# Patient Record
Sex: Male | Born: 1950 | Race: Black or African American | Hispanic: No | Marital: Single | Smoking: Never smoker
Health system: Southern US, Community
[De-identification: ages and names within clinical notes are randomized; demographics above are authoritative.]

## PROBLEM LIST (undated history)

## (undated) DIAGNOSIS — E119 Type 2 diabetes mellitus without complications: Secondary | ICD-10-CM

## (undated) DIAGNOSIS — I10 Essential (primary) hypertension: Secondary | ICD-10-CM

## (undated) HISTORY — PX: NO PAST SURGERIES: SHX2092

---

## 2020-08-27 ENCOUNTER — Emergency Department: Payer: BC Managed Care – PPO

## 2020-08-27 ENCOUNTER — Ambulatory Visit: Payer: BC Managed Care – PPO

## 2020-08-27 ENCOUNTER — Ambulatory Visit (INDEPENDENT_AMBULATORY_CARE_PROVIDER_SITE_OTHER)
Admission: EM | Admit: 2020-08-27 | Discharge: 2020-08-27 | Disposition: A | Discharge status: Other Acute Inpatient Hospital | Payer: BC Managed Care – PPO | Source: Home / Self Care

## 2020-08-27 ENCOUNTER — Inpatient Hospital Stay
Admission: EM | Admit: 2020-08-27 | Discharge: 2020-08-29 | DRG: 872 | Disposition: A | Discharge status: Home / Self Care | Payer: BC Managed Care – PPO | Attending: Internal Medicine | Admitting: Internal Medicine

## 2020-08-27 ENCOUNTER — Other Ambulatory Visit: Payer: Self-pay

## 2020-08-27 DIAGNOSIS — Z6841 Body Mass Index (BMI) 40.0 and over, adult: Secondary | ICD-10-CM

## 2020-08-27 DIAGNOSIS — Z20822 Contact with and (suspected) exposure to covid-19: Secondary | ICD-10-CM | POA: Insufficient documentation

## 2020-08-27 DIAGNOSIS — W19XXXA Unspecified fall, initial encounter: Secondary | ICD-10-CM

## 2020-08-27 DIAGNOSIS — I10 Essential (primary) hypertension: Secondary | ICD-10-CM

## 2020-08-27 DIAGNOSIS — D72829 Elevated white blood cell count, unspecified: Secondary | ICD-10-CM | POA: Diagnosis not present

## 2020-08-27 DIAGNOSIS — N390 Urinary tract infection, site not specified: Secondary | ICD-10-CM | POA: Diagnosis present

## 2020-08-27 DIAGNOSIS — R1084 Generalized abdominal pain: Secondary | ICD-10-CM | POA: Insufficient documentation

## 2020-08-27 DIAGNOSIS — R309 Painful micturition, unspecified: Secondary | ICD-10-CM | POA: Insufficient documentation

## 2020-08-27 DIAGNOSIS — A419 Sepsis, unspecified organism: Secondary | ICD-10-CM | POA: Diagnosis present

## 2020-08-27 DIAGNOSIS — Z9114 Patient's other noncompliance with medication regimen: Secondary | ICD-10-CM

## 2020-08-27 DIAGNOSIS — Z7982 Long term (current) use of aspirin: Secondary | ICD-10-CM

## 2020-08-27 DIAGNOSIS — Z7984 Long term (current) use of oral hypoglycemic drugs: Secondary | ICD-10-CM | POA: Insufficient documentation

## 2020-08-27 DIAGNOSIS — R3915 Urgency of urination: Secondary | ICD-10-CM | POA: Insufficient documentation

## 2020-08-27 DIAGNOSIS — R509 Fever, unspecified: Secondary | ICD-10-CM | POA: Insufficient documentation

## 2020-08-27 DIAGNOSIS — R1010 Upper abdominal pain, unspecified: Secondary | ICD-10-CM | POA: Diagnosis not present

## 2020-08-27 DIAGNOSIS — R0602 Shortness of breath: Secondary | ICD-10-CM | POA: Insufficient documentation

## 2020-08-27 DIAGNOSIS — E119 Type 2 diabetes mellitus without complications: Secondary | ICD-10-CM | POA: Insufficient documentation

## 2020-08-27 DIAGNOSIS — N3000 Acute cystitis without hematuria: Secondary | ICD-10-CM

## 2020-08-27 DIAGNOSIS — E669 Obesity, unspecified: Secondary | ICD-10-CM | POA: Insufficient documentation

## 2020-08-27 HISTORY — DX: Essential (primary) hypertension: I10

## 2020-08-27 HISTORY — DX: Type 2 diabetes mellitus without complications: E11.9

## 2020-08-27 LAB — COMPREHENSIVE METABOLIC PANEL
ALT: 20 U/L (ref 0–44)
ALT: 18 U/L (ref 0–44)
AST: 26 U/L (ref 15–41)
AST: 26 U/L (ref 15–41)
Albumin: 3.9 g/dL (ref 3.5–5.0)
Albumin: 3.9 g/dL (ref 3.5–5.0)
Alkaline Phosphatase: 55 U/L (ref 38–126)
Alkaline Phosphatase: 52 U/L (ref 38–126)
Anion gap: 13 (ref 5–15)
Anion gap: 11 (ref 5–15)
BUN: 14 mg/dL (ref 8–23)
BUN: 14 mg/dL (ref 8–23)
CO2: 26 mmol/L (ref 22–32)
CO2: 24 mmol/L (ref 22–32)
Calcium: 9.6 mg/dL (ref 8.9–10.3)
Calcium: 9.5 mg/dL (ref 8.9–10.3)
Chloride: 98 mmol/L (ref 98–111)
Chloride: 100 mmol/L (ref 98–111)
Creatinine, Ser: 1.08 mg/dL (ref 0.61–1.24)
Creatinine, Ser: 1.04 mg/dL (ref 0.61–1.24)
GFR, Estimated: >60 mL/min (ref >60)
GFR, Estimated: >60 mL/min (ref >60)
Glucose, Bld: 193 mg/dL — ABNORMAL HIGH (ref 70–99)
Glucose, Bld: 176 mg/dL — ABNORMAL HIGH (ref 70–99)
Potassium: 3.9 mmol/L (ref 3.5–5.1)
Potassium: 3.6 mmol/L (ref 3.5–5.1)
Sodium: 137 mmol/L (ref 135–145)
Sodium: 135 mmol/L (ref 135–145)
Total Bilirubin: 2.2 mg/dL — ABNORMAL HIGH (ref 0.3–1.2)
Total Bilirubin: 1.8 mg/dL — ABNORMAL HIGH (ref 0.3–1.2)
Total Protein: 8.8 g/dL — ABNORMAL HIGH (ref 6.5–8.1)
Total Protein: 8.2 g/dL — ABNORMAL HIGH (ref 6.5–8.1)

## 2020-08-27 LAB — RESP PANEL BY RT-PCR (FLU A&B, COVID) ARPGX2
Influenza A by PCR: NEGATIVE
Influenza A by PCR: NEGATIVE
Influenza B by PCR: NEGATIVE
Influenza B by PCR: NEGATIVE
SARS Coronavirus 2 by RT PCR: NEGATIVE
SARS Coronavirus 2 by RT PCR: NEGATIVE

## 2020-08-27 LAB — CBC WITH DIFFERENTIAL/PLATELET
Abs Immature Granulocytes: 1.58 10*3/uL — ABNORMAL HIGH (ref 0.00–0.07)
Abs Immature Granulocytes: 1.08 10*3/uL — ABNORMAL HIGH (ref 0.00–0.07)
Basophils Absolute: 0.1 10*3/uL (ref 0.0–0.1)
Basophils Absolute: 0.1 10*3/uL (ref 0.0–0.1)
Basophils Relative: 0 %
Basophils Relative: 0 %
Eosinophils Absolute: 0 10*3/uL (ref 0.0–0.5)
Eosinophils Absolute: 0 10*3/uL (ref 0.0–0.5)
Eosinophils Relative: 0 %
Eosinophils Relative: 0 %
HCT: 35.5 % — ABNORMAL LOW (ref 39.0–52.0)
HCT: 34.9 % — ABNORMAL LOW (ref 39.0–52.0)
Hemoglobin: 11.8 g/dL — ABNORMAL LOW (ref 13.0–17.0)
Hemoglobin: 11.6 g/dL — ABNORMAL LOW (ref 13.0–17.0)
Immature Granulocytes: 5 %
Immature Granulocytes: 3 %
Lymphocytes Relative: 3 %
Lymphocytes Relative: 3 %
Lymphs Abs: 0.9 10*3/uL (ref 0.7–4.0)
Lymphs Abs: 1 10*3/uL (ref 0.7–4.0)
MCH: 31.6 pg (ref 26.0–34.0)
MCH: 32 pg (ref 26.0–34.0)
MCHC: 33.2 g/dL (ref 30.0–36.0)
MCHC: 33.2 g/dL (ref 30.0–36.0)
MCV: 95.2 fL (ref 80.0–100.0)
MCV: 96.1 fL (ref 80.0–100.0)
Monocytes Absolute: 1.6 10*3/uL — ABNORMAL HIGH (ref 0.1–1.0)
Monocytes Absolute: 1.9 10*3/uL — ABNORMAL HIGH (ref 0.1–1.0)
Monocytes Relative: 5 %
Monocytes Relative: 5 %
Neutro Abs: 29.6 10*3/uL — ABNORMAL HIGH (ref 1.7–7.7)
Neutro Abs: 30.3 10*3/uL — ABNORMAL HIGH (ref 1.7–7.7)
Neutrophils Relative %: 87 %
Neutrophils Relative %: 89 %
Platelets: 286 10*3/uL (ref 150–400)
Platelets: 291 10*3/uL (ref 150–400)
RBC: 3.73 MIL/uL — ABNORMAL LOW (ref 4.22–5.81)
RBC: 3.63 MIL/uL — ABNORMAL LOW (ref 4.22–5.81)
RDW: 12.8 % (ref 11.5–15.5)
RDW: 12.6 % (ref 11.5–15.5)
Smear Review: NORMAL
WBC: 33.8 10*3/uL — ABNORMAL HIGH (ref 4.0–10.5)
WBC: 34.3 10*3/uL — ABNORMAL HIGH (ref 4.0–10.5)
nRBC: 0 % (ref 0.0–0.2)
nRBC: 0 % (ref 0.0–0.2)

## 2020-08-27 LAB — URINALYSIS, COMPLETE (UACMP) WITH MICROSCOPIC
Bilirubin Urine: NEGATIVE
Glucose, UA: NEGATIVE mg/dL
Ketones, ur: NEGATIVE mg/dL
Nitrite: NEGATIVE
Protein, ur: 30 mg/dL — AB
Specific Gravity, Urine: >1.046 — ABNORMAL HIGH (ref 1.005–1.030)
pH: 7 (ref 5.0–8.0)

## 2020-08-27 LAB — PROTIME-INR
INR: 1.2 (ref 0.8–1.2)
Prothrombin Time: 14.9 seconds (ref 11.4–15.2)

## 2020-08-27 LAB — PROCALCITONIN: Procalcitonin: 2.63 ng/mL

## 2020-08-27 LAB — HIV ANTIBODY (ROUTINE TESTING W REFLEX): HIV Screen 4th Generation wRfx: NONREACTIVE

## 2020-08-27 LAB — APTT: aPTT: 34 seconds (ref 24–36)

## 2020-08-27 LAB — GLUCOSE, CAPILLARY
Glucose-Capillary: 136 mg/dL — ABNORMAL HIGH (ref 70–99)
Glucose-Capillary: 144 mg/dL — ABNORMAL HIGH (ref 70–99)

## 2020-08-27 LAB — LACTIC ACID, PLASMA: Lactic Acid, Venous: 1.9 mmol/L (ref 0.5–1.9)

## 2020-08-27 MED ORDER — INSULIN ASPART 100 UNIT/ML ~~LOC~~ SOLN: 0.0000 [IU] | Freq: Every day | SUBCUTANEOUS | Status: DC

## 2020-08-27 MED ORDER — IOHEXOL 300 MG/ML  SOLN
125.0000 mL | Freq: Once | INTRAMUSCULAR | Status: AC | PRN
  Administered 2020-08-27: 125 mL via INTRAVENOUS

## 2020-08-27 MED ORDER — SODIUM CHLORIDE 0.9 % IV BOLUS (SEPSIS)
1000.0000 mL | Freq: Once | INTRAVENOUS | Status: AC
  Administered 2020-08-27: 1000 mL via INTRAVENOUS

## 2020-08-27 MED ORDER — ACETAMINOPHEN 325 MG PO TABS
650.0000 mg | ORAL_TABLET | Freq: Four times a day (QID) | ORAL | Status: DC | PRN
  Administered 2020-08-28: 650 mg via ORAL
  Filled 2020-08-27: qty 2

## 2020-08-27 MED ORDER — ADULT MULTIVITAMIN W/MINERALS CH
1.0000 | ORAL_TABLET | Freq: Every day | ORAL | Status: DC
  Administered 2020-08-27 – 2020-08-29 (×3): 1 via ORAL
  Filled 2020-08-27 (×3): qty 1

## 2020-08-27 MED ORDER — ONDANSETRON HCL 4 MG/2ML IJ SOLN: 4.0000 mg | Freq: Three times a day (TID) | INTRAMUSCULAR | Status: DC | PRN

## 2020-08-27 MED ORDER — INSULIN ASPART 100 UNIT/ML ~~LOC~~ SOLN
0.0000 [IU] | Freq: Three times a day (TID) | SUBCUTANEOUS | Status: DC
  Administered 2020-08-27 – 2020-08-28 (×2): 1 [IU] via SUBCUTANEOUS
  Filled 2020-08-27 (×2): qty 1

## 2020-08-27 MED ORDER — ENOXAPARIN SODIUM 40 MG/0.4ML ~~LOC~~ SOLN: 40.0000 mg | SUBCUTANEOUS | Status: DC

## 2020-08-27 MED ORDER — LACTATED RINGERS IV SOLN: INTRAVENOUS | Status: AC

## 2020-08-27 MED ORDER — SODIUM CHLORIDE 0.9 % IV SOLN
2.0000 g | INTRAVENOUS | Status: DC
  Administered 2020-08-27 – 2020-08-29 (×3): 2 g via INTRAVENOUS
  Filled 2020-08-27: qty 2
  Filled 2020-08-27 (×4): qty 20

## 2020-08-27 MED ORDER — SODIUM CHLORIDE 0.9 % IV BOLUS (SEPSIS)
800.0000 mL | Freq: Once | INTRAVENOUS | Status: AC
  Administered 2020-08-27: 800 mL via INTRAVENOUS

## 2020-08-27 MED ORDER — ENOXAPARIN SODIUM 80 MG/0.8ML ~~LOC~~ SOLN
0.5000 mg/kg | SUBCUTANEOUS | Status: DC
  Administered 2020-08-27 – 2020-08-28 (×2): 62.5 mg via SUBCUTANEOUS
  Filled 2020-08-27 (×3): qty 0.8

## 2020-08-27 MED ORDER — HYDRALAZINE HCL 20 MG/ML IJ SOLN: 5.0000 mg | INTRAMUSCULAR | Status: DC | PRN

## 2020-08-27 MED ORDER — SODIUM CHLORIDE 0.9 % IV SOLN
2.0000 g | Freq: Once | INTRAVENOUS | Status: AC
  Administered 2020-08-27: 2 g via INTRAVENOUS
  Filled 2020-08-27: qty 2

## 2020-08-27 MED ORDER — ASPIRIN EC 81 MG PO TBEC
81.0000 mg | DELAYED_RELEASE_TABLET | Freq: Every day | ORAL | Status: DC
  Administered 2020-08-27 – 2020-08-29 (×3): 81 mg via ORAL
  Filled 2020-08-27 (×3): qty 1

## 2020-08-27 MED ORDER — METRONIDAZOLE IN NACL 5-0.79 MG/ML-% IV SOLN
500.0000 mg | Freq: Once | INTRAVENOUS | Status: AC
  Administered 2020-08-27: 500 mg via INTRAVENOUS
  Filled 2020-08-27: qty 100

## 2020-08-27 MED ORDER — VANCOMYCIN HCL IN DEXTROSE 1-5 GM/200ML-% IV SOLN
1000.0000 mg | Freq: Once | INTRAVENOUS | Status: AC
  Administered 2020-08-27: 1000 mg via INTRAVENOUS
  Filled 2020-08-27: qty 200

## 2020-08-27 NOTE — Progress Notes (Signed)
PHARMACY -  BRIEF ANTIBIOTIC NOTE   Pharmacy has received consult(s) for Cefepime and Vancomycin from an ED provider.  The patient's profile has been reviewed for ht/wt/allergies/indication/available labs.    One time order(s) placed by MD for Cefepime 2gm and Vancomycin 1 gram  Further antibiotics/pharmacy consults should be ordered by admitting physician if indicated.                       Thank you, Binh Doten A 08/27/2020  11:08 AM

## 2020-08-27 NOTE — ED Notes (Signed)
Patient is being discharged from the Urgent Care and sent to the [facility] Emergency Department via EMS. Per Becky Augusta, NP, patient is in need of higher level of care due to abnormal lab and vital signs. Patient is aware and verbalizes understanding of plan of care.  Vitals:   08/27/20 0922  BP: (!) 168/81  Pulse: (!) 116  Resp: (!) 40  Temp: (!) 101.8 F (38.8 C)  SpO2: 95%

## 2020-08-27 NOTE — Progress Notes (Signed)
CODE SEPSIS - PHARMACY COMMUNICATION  **Broad Spectrum Antibiotics should be administered within 1 hour of Sepsis diagnosis**  Time Code Sepsis Called/Page Received: 08/27/20 1105  Antibiotics Ordered: Cefepime and Vanc  Time of 1st antibiotic administration: 1116  Additional action taken by pharmacy:    If necessary, Name of Provider/Nurse Contacted:      Angelique Blonder ,PharmD Clinical Pharmacist  08/27/2020  11:32 AM

## 2020-08-27 NOTE — Discharge Instructions (Addendum)
Patient transported to [facility] via EMS for further evaluation based upon his tachypnea, febrile state, and leukocytosis.

## 2020-08-27 NOTE — Progress Notes (Signed)
PHARMACIST - PHYSICIAN COMMUNICATION  CONCERNING:  Enoxaparin (Lovenox) for DVT Prophylaxis    RECOMMENDATION: Patient was prescribed enoxaprin 40mg  q24 hours for VTE prophylaxis.   Filed Weights   08/27/20 1054  Weight: 127 kg (279 lb 15.8 oz)    Body mass index is 51.21 kg/m.  Estimated Creatinine Clearance: 79.3 mL/min (by C-G formula based on SCr of 1.04 mg/dL).   Based on [facility] policy patient is candidate for enoxaparin 0.5mg /kg TBW SQ every 24 hours based on BMI being >30.  DESCRIPTION: Pharmacy has adjusted enoxaparin dose per [facility] policy.  Patient is now receiving enoxaparin 62.5 mg every 24 hours    [facility], PharmD Clinical Pharmacist  08/27/2020 2:09 PM

## 2020-08-27 NOTE — ED Triage Notes (Signed)
Patient complains of upper abdominal pain that started yesterday. States that he has been unable to eat since then. Patient states that he has also been short of breath. Patient states that he fell coming in to the building today and thinks he got "turned around". Another patient saw this happened and was concerned that he fainted.

## 2020-08-27 NOTE — ED Triage Notes (Signed)
Pt to ED from UC via [facility] for chief c/o abdominal pain since yesterday. Ems reports wbc 33. Temp 99.8. ST with PVCs

## 2020-08-27 NOTE — ED Provider Notes (Signed)
[facility] Emergency Department Provider Note  ____________________________________________   Event Date/Time   First MD Initiated Contact with Patient 08/27/20 1049     (approximate)  I have reviewed the triage vital signs and the nursing notes.   HISTORY  Chief Complaint Abdominal Pain    HPI Daniel Trevino is a 70 y.o. male with diabetes hypertension who comes in with upper abdominal pain.  Patient was seen at urgent care.  On review of records patient was noted to be tachycardic and febrile to 101.8 with generalized abdominal pain.  Patient states he has had abdominal pain for the past few days, constant, lower abdomen, nothing makes better, nothing makes it worse.  He denies any chest pain, shortness of breath.  Denies any dysuria.  Patient had a white count of 33.  LFTs were negative.  COVID and flu were negative.  However given patient meets sepsis criteria he was sent for the emergency department for further work-up.          Past Medical History:  Diagnosis Date  . Diabetes mellitus without complication (HCC)   . Hypertension     There are no problems to display for this patient.   Past Surgical History:  Procedure Laterality Date  . NO PAST SURGERIES      Prior to Admission medications   Medication Sig Start Date End Date Taking? Authorizing Provider  aspirin 81 MG EC tablet Take by mouth.    [provider]  metFORMIN (GLUCOPHAGE) 500 MG tablet Take 500 mg by mouth 2 (two) times daily. 07/21/20   [provider]    Allergies Patient has no known allergies.  No family history on file.  Social History Social History   Tobacco Use  . Smoking status: Never Smoker  . Smokeless tobacco: Never Used  Vaping Use  . Vaping Use: Never used  Substance Use Topics  . Alcohol use: Not Currently  . Drug use: Not Currently      Review of Systems Constitutional: Positive fevers Eyes: No visual changes. ENT: No  sore throat. Cardiovascular: Denies chest pain. Respiratory: Denies shortness of breath. Gastrointestinal: Abdominal pain no nausea, no vomiting.  No diarrhea.  No constipation. Genitourinary: Negative for dysuria. Musculoskeletal: Negative for back pain. Skin: Negative for rash. Neurological: Negative for headaches, focal weakness or numbness. All other ROS negative ____________________________________________   PHYSICAL EXAM:  VITAL SIGNS: Blood pressure 131/65, pulse 92, temperature 100 F (37.8 C), temperature source Oral, resp. rate (!) 35, height 5\' 2"  (1.575 m), weight 127 kg, SpO2 97 %.  Constitutional: Alert and oriented.  Mild distress Eyes: Conjunctivae are normal. EOMI. Head: Atraumatic. Nose: No congestion/rhinnorhea. Mouth/Throat: Mucous membranes are moist.   Neck: No stridor. Trachea Midline. FROM Cardiovascular: Tachycardic, regular rhythm. Grossly normal heart sounds.  Good peripheral circulation. Respiratory: Normal respiratory effort.  No retractions. Lungs CTAB. Gastrointestinal: Soft but tender in the lower abdomen no distention. No abdominal bruits.  Musculoskeletal: No lower extremity tenderness nor edema.  No joint effusions. Neurologic:  Normal speech and language. No gross focal neurologic deficits are appreciated.  Skin:  Skin is warm, dry and intact. No rash noted. Psychiatric: Mood and affect are normal. Speech and behavior are normal. GU: Deferred   ____________________________________________   LABS (all labs ordered are listed, but only abnormal results are displayed)  Labs Reviewed  COMPREHENSIVE METABOLIC PANEL - Abnormal; Notable for the following components:      Result Value   Glucose, Bld 176 (*)  Total Protein 8.2 (*)    Total Bilirubin 1.8 (*)    All other components within normal limits  CBC WITH DIFFERENTIAL/PLATELET - Abnormal; Notable for the following components:   WBC 34.3 (*)    RBC 3.63 (*)    Hemoglobin 11.6 (*)     HCT 34.9 (*)    Neutro Abs 30.3 (*)    Monocytes Absolute 1.9 (*)    Abs Immature Granulocytes 1.08 (*)    All other components within normal limits  URINALYSIS, COMPLETE (UACMP) WITH MICROSCOPIC - Abnormal; Notable for the following components:   Color, Urine YELLOW (*)    APPearance CLEAR (*)    Specific Gravity, Urine >1.046 (*)    Hgb urine dipstick MODERATE (*)    Protein, ur 30 (*)    Leukocytes,Ua MODERATE (*)    Bacteria, UA FEW (*)    All other components within normal limits  RESP PANEL BY RT-PCR (FLU A&B, COVID) ARPGX2  CULTURE, BLOOD (ROUTINE X 2)  CULTURE, BLOOD (ROUTINE X 2)  URINE CULTURE  LACTIC ACID, PLASMA  PROTIME-INR  APTT   ____________________________________________   ED ECG REPORT I, Concha Se, the attending physician, personally viewed and interpreted this ECG.  Sinus tachycardia rate of 115, no ST elevation, no T wave inversions, normal intervals ____________________________________________  RADIOLOGY Vela Prose, personally viewed and evaluated these images (plain radiographs) as part of my medical decision making, as well as reviewing the written report by the radiologist.  ED MD interpretation: No pneumonia noted  Official radiology report(s): DG Chest Port 1 View  Result Date: 08/27/2020 CLINICAL DATA:  Chest pain and questionable sepsis EXAM: PORTABLE CHEST 1 VIEW COMPARISON:  None. FINDINGS: Cardiac shadow is enlarged. Central vascular congestion is noted. No significant CAD is seen. No focal infiltrate is noted. IMPRESSION: Mild central vascular congestion without definitive edema. Electronically Signed   By: Alcide Clever M.D.   On: 08/27/2020 11:20    ____________________________________________   PROCEDURES  Procedure(s) performed (including Critical Care):  .Critical Care Performed by: Concha Se, MD Authorized by: Concha Se, MD   Critical care provider statement:    Critical care time (minutes):  45   Critical  care was necessary to treat or prevent imminent or life-threatening deterioration of the following conditions:  Sepsis   Critical care was time spent personally by me on the following activities:  Discussions with consultants, evaluation of patient's response to treatment, examination of patient, ordering and performing treatments and interventions, ordering and review of laboratory studies, ordering and review of radiographic studies, pulse oximetry, re-evaluation of patient's condition, obtaining history from patient or surrogate and review of old charts .1-3 Lead EKG Interpretation Performed by: Concha Se, MD Authorized by: Concha Se, MD     Interpretation: normal     ECG rate:  90s    ECG rate assessment: normal     Rhythm: sinus rhythm     Ectopy: none     Conduction: normal   Comments:     Initially tachycardic but improved with fluid     ____________________________________________   INITIAL IMPRESSION / ASSESSMENT AND PLAN / ED COURSE  Daniel Trevino was evaluated in Emergency Department on 08/27/2020 for the symptoms described in the history of present illness. He was evaluated in the context of the global COVID-19 pandemic, which necessitated consideration that the patient might be at risk for infection with the SARS-CoV-2 virus that causes COVID-19. Institutional protocols and algorithms that  pertain to the evaluation of patients at risk for COVID-19 are in a state of rapid change based on information released by regulatory bodies including the CDC and federal and state organizations. These policies and algorithms were followed during the patient's care in the ED.    Patient came in tachycardic with elevated white count and fever at outside urgent care.  Patient meet SIRS criteria and I am concerned about sepsis.  Sepsis alert was called.  Patient was started on broad-spectrum antibiotics given unclear source plan concerned mostly about abdominal source as well as fluid  resuscitation with ideal body weight at 30 cc/kg.  Chest x-ray ordered to evaluate for pneumonia, urine to evaluate for UTI.  CT scan to evaluate for appendicitis, diverticulitis or other acute abdominal infection.  Lactate was normal  White count significantly elevated at 34  Repeat post focused examination for sepsis protocol shows patient remains alert and oriented x3, cardiovascularly he feels warm and well-perfused and heart rate has now resolved, respiratory patient does not appear to be in any acute respiratory distress, no stridor, no edema in his legs.    CT scan concerning for discitis with decompressed urinary bladder.  His UA does have 21-50 WBCs.  Urine culture is pending.  Given patient's sepsis criteria I think would be best to admit him to the hospital for rule out of blood cultures.  Discussed with patient he feels comfortable with this plan       ____________________________________________   FINAL CLINICAL IMPRESSION(S) / ED DIAGNOSES   Final diagnoses:  Sepsis, due to unspecified organism, unspecified whether acute organ dysfunction present (HCC)  Acute cystitis without hematuria      MEDICATIONS GIVEN DURING THIS VISIT:  Medications  lactated ringers infusion (0 mLs Intravenous Hold 08/27/20 1110)  sodium chloride 0.9 % bolus 1,000 mL (1,000 mLs Intravenous New Bag/Given 08/27/20 1113)    And  sodium chloride 0.9 % bolus 800 mL (800 mLs Intravenous New Bag/Given 08/27/20 1149)  ceFEPIme (MAXIPIME) 2 g in sodium chloride 0.9 % 100 mL IVPB (0 g Intravenous Stopped 08/27/20 1148)  metroNIDAZOLE (FLAGYL) IVPB 500 mg (500 mg Intravenous New Bag/Given 08/27/20 1115)  vancomycin (VANCOCIN) IVPB 1000 mg/200 mL premix (1,000 mg Intravenous New Bag/Given 08/27/20 1149)  iohexol (OMNIPAQUE) 300 MG/ML solution 125 mL (125 mLs Intravenous Contrast Given 08/27/20 1212)     ED Discharge Orders    None       Note:  This document was prepared using Dragon voice  recognition software and may include unintentional dictation errors.   Concha Se, MD 08/27/20 1350

## 2020-08-27 NOTE — ED Provider Notes (Signed)
[facility]    CSN: [id number] Arrival date & time: 08/27/20  0907      History   Chief Complaint Chief Complaint  Patient presents with  . Abdominal Pain    HPI Daniel Trevino is a 70 y.o. male.   HPI   70 year old male here for evaluation of upper abdominal pain.  Patient reports that he developed upper abdominal pain, decreased appetite, and shortness of breath last night.  Patient suffered a fall coming into the building.  Patient states that he remembers the fall but the other patient's family member who helped him up said he seemed very disoriented.  Additionally, patient reports that he has been having urinary urgency, frequency, and pain with urination.  Patient states that he moves his bowels every time he urinates but not much is coming out.  Patient asked about diarrhea and he was noncommittal in his response.  Patient has a history of type 2 diabetes and high blood pressure.  Patient is currently taking Metformin but states that he is not taking his hydrochlorothiazide.  Patient denies seeing any blood in his stool.  Patient asked if he was urinating blood and he denied but when asked if his urine was cloudy he said that his urine was red.  Past Medical History:  Diagnosis Date  . Diabetes mellitus without complication (HCC)   . Hypertension     There are no problems to display for this patient.   Past Surgical History:  Procedure Laterality Date  . NO PAST SURGERIES         Home Medications    Prior to Admission medications   Medication Sig Start Date End Date Taking? Authorizing Provider  aspirin 81 MG EC tablet Take by mouth.   Yes [provider]  metFORMIN (GLUCOPHAGE) 500 MG tablet Take 500 mg by mouth 2 (two) times daily. 07/21/20  Yes [provider]    Family History History reviewed. No pertinent family history.  Social History Social History   Tobacco Use  . Smoking status: Never Smoker  . Smokeless tobacco:  Never Used  Vaping Use  . Vaping Use: Never used  Substance Use Topics  . Alcohol use: Not Currently  . Drug use: Not Currently     Allergies   Patient has no known allergies.   Review of Systems Review of Systems  Constitutional: Positive for appetite change and fatigue. Negative for activity change and fever.  Gastrointestinal: Positive for abdominal pain. Negative for blood in stool, nausea and vomiting.  Genitourinary: Positive for dysuria, frequency, hematuria and urgency.  Skin: Negative for rash.  Hematological: Negative.   Psychiatric/Behavioral: Negative.      Physical Exam Triage Vital Signs ED Triage Vitals [08/27/20 0922]  Enc Vitals Group     BP (!) 168/81     Pulse Rate (!) 116     Resp 19     Temp (!) 101.8 F (38.8 C)     Temp Source Oral     SpO2 95 %     Weight 280 lb (127 kg)     Height 5\' 2"  (1.575 m)     Head Circumference      Peak Flow      Pain Score 8     Pain Loc      Pain Edu?      Excl. in GC?    No data found.  Updated Vital Signs BP (!) 168/81 (BP Location: Left Arm)   Pulse (!) 116  Temp (!) 101.8 F (38.8 C) (Oral)   Resp (!) 40   Ht 5\' 2"  (1.575 m)   Wt 280 lb (127 kg)   SpO2 95%   BMI 51.21 kg/m   Visual Acuity Right Eye Distance:   Left Eye Distance:   Bilateral Distance:    Right Eye Near:   Left Eye Near:    Bilateral Near:     Physical Exam Vitals and nursing note reviewed.  Constitutional:      Appearance: He is obese. He is ill-appearing. He is not diaphoretic.  HENT:     Head: Normocephalic and atraumatic.  Cardiovascular:     Rate and Rhythm: Normal rate.     Heart sounds: Normal heart sounds. No murmur heard. No gallop.   Pulmonary:     Effort: Pulmonary effort is normal.     Breath sounds: Normal breath sounds. No wheezing, rhonchi or rales.  Abdominal:     General: Abdomen is protuberant. Bowel sounds are normal. There is distension.     Palpations: Abdomen is soft. There is no hepatomegaly  or splenomegaly.     Tenderness: There is generalized abdominal tenderness.     Hernia: No hernia is present.  Skin:    General: Skin is warm and dry.     Capillary Refill: Capillary refill takes less than 2 seconds.     Findings: No erythema or rash.  Neurological:     General: No focal deficit present.     Mental Status: He is alert and oriented to person, place, and time.  Psychiatric:        Mood and Affect: Mood normal.        Behavior: Behavior normal.      UC Treatments / Results  Labs (all labs ordered are listed, but only abnormal results are displayed) Labs Reviewed  CBC WITH DIFFERENTIAL/PLATELET - Abnormal; Notable for the following components:      Result Value   WBC 33.8 (*)    RBC 3.73 (*)    Hemoglobin 11.8 (*)    HCT 35.5 (*)    All other components within normal limits  COMPREHENSIVE METABOLIC PANEL - Abnormal; Notable for the following components:   Glucose, Bld 193 (*)    Total Protein 8.8 (*)    Total Bilirubin 2.2 (*)    All other components within normal limits  RESP PANEL BY RT-PCR (FLU A&B, COVID) ARPGX2    EKG   Radiology No results found.  Procedures Procedures (including critical care time)  Medications Ordered in UC Medications - No data to display  Initial Impression / Assessment and Plan / UC Course  I have reviewed the triage vital signs and the nursing notes.  Pertinent labs & imaging results that were available during my care of the patient were reviewed by me and considered in my medical decision making (see chart for details).   Patient is here for evaluation of abdominal pain and shortness of breath.  Patient is an obese, ill-appearing 70 year old male who may have had a syncopal episode on the way into the clinic but it is unclear.  Patient states that he remembers the fall and that he just got off balance.  Patient is complaining of fatigue and he does have demonstrable weakness.  Patient had difficulty transitioning from the  wheelchair to the stretcher.  Patient is tachypneic with a respiratory rate around 32 after transitioning to the stretcher.  Lung sounds are clear in all fields.  Patient's oral mucous  membranes are dry and sticky.  Patient has ashy skin and decreased skin turgor on exam.  Patient's abdomen is distended with positive bowel sounds in all quadrants.  Patient has diffuse tenderness to palpation with no focal tenderness identified.  Well check COVID swab, CBC, CMP, UA, and obtain chest x-ray.  CBC shows a WBC count of 33.8, H&H is 11.8 and 35.5.  CMP shows a glucose of 193 and an elevated total protein of 8.8.  Transaminases are normal, sodium is 137, potassium 3.9, chloride 98.  Patient's anion gap is 13.  [facility] panel is negative for Covid and influenza.  Based on white count, tachypnea, fever, and fatigue will send patient to the emergency department for evaluation.  Report called to [facility] the charge nurse in the ER at [facility].  Awaiting EMS for transport.  Discussed the need to have a hospital evaluation with patient and he is agreeable.  Attempted to collect a urine sample from the patient but he was unable to void.  Patient does have a saturated diaper in place with colored urine.    Patient transported to [facility] via EMS.  Final Clinical Impressions(s) / UC Diagnoses   Final diagnoses:  Generalized abdominal pain  Leukocytosis, unspecified type     Discharge Instructions     Patient transported to [facility] via EMS for further evaluation based upon his tachypnea, febrile state, and leukocytosis.    ED Prescriptions    None     PDMP not reviewed this encounter.   Becky Augusta, NP 08/27/20 1015

## 2020-08-27 NOTE — ED Notes (Signed)
This RN to bedside to answer call light. Pt found to have gotten out of stretcher with side rails up, pulled out IV, stool on bed. Pt stated he was trying to see what was on his lunch tray.  This RN and caitlyn NT changed pt, linens changed, positioned pt where able to reach lunch tray.  Admitting MD at bedside

## 2020-08-27 NOTE — Sepsis Progress Note (Signed)
eLink is following this Code Sepsis. °

## 2020-08-27 NOTE — H&P (Signed)
History and Physical    Mikelle Minear TLX:[id number] DOB: [birth year] DOA: 08/27/2020  Referring MD/NP/PA:   PCP: Patient, No Pcp Per   Patient coming from:  The patient is coming from home.  At baseline, pt is independent for most of ADL.        Chief Complaint: Fever, dysuria, abdominal pain  HPI: Daniel Trevino is a 70 y.o. male with medical history significant of hypertension, diabetes mellitus, who presents with fever, dysuria and abdominal pain.  Patient states that he started having dysuria, increased urinary frequency, burning on urination since yesterday.  He also complains of suprapubic abdominal pain which is constant, mild to moderate, aching, nonradiating.  No hematuria. He also has fever and chills.  Patient denies chest pain, shortness breath, cough to me.  No nausea, vomiting, diarrhea.  Patient states that he has generalized weakness.  He was seen in UC and was sent to ED. He states that while he was waiting at the outside of clinic, he fell accidentally.  He strongly denies any head or neck injury.  Denies loss of consciousness or passing out.  He refused CT scan of head and neck.  ED Course: pt was found to have WBC 34.3, lactic acid 1.9, INR 1.2, PTT 34, negative Covid PCR, positive urinalysis (clear appearance, moderate amount of leukocyte, few bacteria, WBC 21-50), electrolytes renal function okay, temperature one 1.8, blood pressure 131/65, tachycardia with heart rate of 117, tachypnea with RR 40, 35, oxygen saturation 95% on room air.  Chest x-ray showed vascular congestion no obvious edema.  CT abdomen/pelvis showed acute cystitis without hydronephrosis or obstructive stone.  Patient is admitted to MedSurg bed as inpatient   Review of Systems:   General: has fevers, chills, no body weight gain, has fatigue HEENT: no blurry vision, hearing changes or sore throat Respiratory: no dyspnea, coughing, wheezing CV: no chest pain, no palpitations GI: no nausea, vomiting,  has lower abdominal pain, no diarrhea, constipation GU: has dysuria, burning on urination, has increased urinary frequency, no hematuria  Ext: no leg edema Neuro: no unilateral weakness, numbness, or tingling, no vision change or hearing loss. Has fall Skin: no rash, no skin tear. MSK: No muscle spasm, no deformity, no limitation of range of movement in spin Heme: No easy bruising.  Travel history: No recent long distant travel.  Allergy: No Known Allergies  Past Medical History:  Diagnosis Date  . Diabetes mellitus without complication (HCC)   . Hypertension     Past Surgical History:  Procedure Laterality Date  . NO PAST SURGERIES      Social History:  reports that he has never smoked. He has never used smokeless tobacco. He reports previous alcohol use. He reports previous drug use.  Family History: Reviewed with patient, patient states that all family members do not have significant medical issues.  Prior to Admission medications   Medication Sig Start Date End Date Taking? Authorizing Provider  aspirin 81 MG EC tablet Take by mouth.    [provider]  metFORMIN (GLUCOPHAGE) 500 MG tablet Take 500 mg by mouth 2 (two) times daily. 07/21/20   [provider]    Physical Exam: Vitals:   08/27/20 1053 08/27/20 1054 08/27/20 1100 08/27/20 1130  BP: (!) 166/78  (!) 164/69 131/65  Pulse: (!) 117  (!) 104 92  Resp: (!) 26  (!) 34 (!) 35  Temp: 100 F (37.8 C)     TempSrc: Oral     SpO2: 95%  95%  97%  Weight:  127 kg    Height:  5\' 2"  (1.575 m)     General: Not in acute distress HEENT:       Eyes: PERRL, EOMI, no scleral icterus.       ENT: No discharge from the ears and nose, no pharynx injection, no tonsillar enlargement.        Neck: No JVD, no bruit, no mass felt. Heme: No neck lymph node enlargement. Cardiac: S1/S2, RRR, No murmurs, No gallops or rubs. Respiratory: No rales, wheezing, rhonchi or rubs. GI: Soft, nondistended, has tenderness in  suprapubic abdomen, no rebound pain, no organomegaly, BS present. GU: No hematuria Ext: No pitting leg edema bilaterally. 1+DP/PT pulse bilaterally. Musculoskeletal: No joint deformities, No joint redness or warmth, no limitation of ROM in spin. Skin: No rashes.  Neuro: Alert, oriented X3, cranial nerves II-XII grossly intact, moves all extremities normally. Psych: Patient is not psychotic, no suicidal or hemocidal ideation.  Labs on Admission: I have personally reviewed following labs and imaging studies  CBC: Recent Labs  Lab 08/27/20 0930 08/27/20 1105  WBC 33.8* 34.3*  NEUTROABS 29.6* 30.3*  HGB 11.8* 11.6*  HCT 35.5* 34.9*  MCV 95.2 96.1  PLT 286 291   Basic Metabolic Panel: Recent Labs  Lab 08/27/20 0930 08/27/20 1105  NA 137 135  K 3.9 3.6  CL 98 100  CO2 26 24  GLUCOSE 193* 176*  BUN 14 14  CREATININE 1.08 1.04  CALCIUM 9.6 9.5   GFR: Estimated Creatinine Clearance: 79.3 mL/min (by C-G formula based on SCr of 1.04 mg/dL). Liver Function Tests: Recent Labs  Lab 08/27/20 0930 08/27/20 1105  AST 26 26  ALT 20 18  ALKPHOS 55 52  BILITOT 2.2* 1.8*  PROT 8.8* 8.2*  ALBUMIN 3.9 3.9   No results for input(s): LIPASE, AMYLASE in the last 168 hours. No results for input(s): AMMONIA in the last 168 hours. Coagulation Profile: Recent Labs  Lab 08/27/20 1105  INR 1.2   Cardiac Enzymes: No results for input(s): CKTOTAL, CKMB, CKMBINDEX, TROPONINI in the last 168 hours. BNP (last 3 results) No results for input(s): PROBNP in the last 8760 hours. HbA1C: No results for input(s): HGBA1C in the last 72 hours. CBG: No results for input(s): GLUCAP in the last 168 hours. Lipid Profile: No results for input(s): CHOL, HDL, LDLCALC, TRIG, CHOLHDL, LDLDIRECT in the last 72 hours. Thyroid Function Tests: No results for input(s): TSH, T4TOTAL, FREET4, T3FREE, THYROIDAB in the last 72 hours. Anemia Panel: No results for input(s): VITAMINB12, FOLATE, FERRITIN, TIBC,  IRON, RETICCTPCT in the last 72 hours. Urine analysis:    Component Value Date/Time   COLORURINE YELLOW (A) 08/27/2020 1105   APPEARANCEUR CLEAR (A) 08/27/2020 1105   LABSPEC >1.046 (H) 08/27/2020 1105   PHURINE 7.0 08/27/2020 1105   GLUCOSEU NEGATIVE 08/27/2020 1105   HGBUR MODERATE (A) 08/27/2020 1105   BILIRUBINUR NEGATIVE 08/27/2020 1105   KETONESUR NEGATIVE 08/27/2020 1105   PROTEINUR 30 (A) 08/27/2020 1105   NITRITE NEGATIVE 08/27/2020 1105   LEUKOCYTESUR MODERATE (A) 08/27/2020 1105   Sepsis Labs: @LABRCNTIP (procalcitonin:4,lacticidven:4) ) Recent Results (from the past 240 hour(s))  Resp Panel by RT-PCR (Flu A&B, Covid) Nasopharyngeal Swab     Status: None   Collection Time: 08/27/20  9:30 AM   Specimen: Nasopharyngeal Swab; Nasopharyngeal(NP) swabs in vial transport medium  Result Value Ref Range Status   SARS Coronavirus 2 by RT PCR NEGATIVE NEGATIVE Final    Comment: (NOTE) SARS-CoV-2 target nucleic  acids are NOT DETECTED.  The SARS-CoV-2 RNA is generally detectable in upper respiratory specimens during the acute phase of infection. The lowest concentration of SARS-CoV-2 viral copies this assay can detect is 138 copies/mL. A negative result does not preclude SARS-Cov-2 infection and should not be used as the sole basis for treatment or other patient management decisions. A negative result may occur with  improper specimen collection/handling, submission of specimen other than nasopharyngeal swab, presence of viral mutation(s) within the areas targeted by this assay, and inadequate number of viral copies(<138 copies/mL). A negative result must be combined with clinical observations, patient history, and epidemiological information. The expected result is Negative.  Fact Sheet for Patients:  BloggerCourse.com  Fact Sheet for Healthcare Providers:  SeriousBroker.it  This test is no t yet approved or cleared by the  [place] FDA and  has been authorized for detection and/or diagnosis of SARS-CoV-2 by FDA under an Emergency Use Authorization (EUA). This EUA will remain  in effect (meaning this test can be used) for the duration of the COVID-19 declaration under Section 564(b)(1) of the Act, 21 U.S.C.section 360bbb-3(b)(1), unless the authorization is terminated  or revoked sooner.       Influenza A by PCR NEGATIVE NEGATIVE Final   Influenza B by PCR NEGATIVE NEGATIVE Final    Comment: (NOTE) The Xpert Xpress SARS-CoV-2/FLU/RSV plus assay is intended as an aid in the diagnosis of influenza from Nasopharyngeal swab specimens and should not be used as a sole basis for treatment. Nasal washings and aspirates are unacceptable for Xpert Xpress SARS-CoV-2/FLU/RSV testing.  Fact Sheet for Patients: BloggerCourse.com  Fact Sheet for Healthcare Providers: SeriousBroker.it  This test is not yet approved or cleared by the [place] FDA and has been authorized for detection and/or diagnosis of SARS-CoV-2 by FDA under an Emergency Use Authorization (EUA). This EUA will remain in effect (meaning this test can be used) for the duration of the COVID-19 declaration under Section 564(b)(1) of the Act, 21 U.S.C. section 360bbb-3(b)(1), unless the authorization is terminated or revoked.  Performed at [facility], [address], [place], [place] [address]   Resp Panel by RT-PCR (Flu A&B, Covid) Nasopharyngeal Swab     Status: None   Collection Time: 08/27/20 11:05 AM   Specimen: Nasopharyngeal Swab; Nasopharyngeal(NP) swabs in vial transport medium  Result Value Ref Range Status   SARS Coronavirus 2 by RT PCR NEGATIVE NEGATIVE Final    Comment: (NOTE) SARS-CoV-2 target nucleic acids are NOT DETECTED.  The SARS-CoV-2 RNA is generally detectable in upper respiratory specimens during the acute phase of infection. The  lowest concentration of SARS-CoV-2 viral copies this assay can detect is 138 copies/mL. A negative result does not preclude SARS-Cov-2 infection and should not be used as the sole basis for treatment or other patient management decisions. A negative result may occur with  improper specimen collection/handling, submission of specimen other than nasopharyngeal swab, presence of viral mutation(s) within the areas targeted by this assay, and inadequate number of viral copies(<138 copies/mL). A negative result must be combined with clinical observations, patient history, and epidemiological information. The expected result is Negative.  Fact Sheet for Patients:  BloggerCourse.com  Fact Sheet for Healthcare Providers:  SeriousBroker.it  This test is no t yet approved or cleared by the [place] FDA and  has been authorized for detection and/or diagnosis of SARS-CoV-2 by FDA under an Emergency Use Authorization (EUA). This EUA will remain  in effect (meaning this test can be  used) for the duration of the COVID-19 declaration under Section 564(b)(1) of the Act, 21 U.S.C.section 360bbb-3(b)(1), unless the authorization is terminated  or revoked sooner.       Influenza A by PCR NEGATIVE NEGATIVE Final   Influenza B by PCR NEGATIVE NEGATIVE Final    Comment: (NOTE) The Xpert Xpress SARS-CoV-2/FLU/RSV plus assay is intended as an aid in the diagnosis of influenza from Nasopharyngeal swab specimens and should not be used as a sole basis for treatment. Nasal washings and aspirates are unacceptable for Xpert Xpress SARS-CoV-2/FLU/RSV testing.  Fact Sheet for Patients: BloggerCourse.com  Fact Sheet for Healthcare Providers: SeriousBroker.it  This test is not yet approved or cleared by the [place] FDA and has been authorized for detection and/or diagnosis of SARS-CoV-2 by FDA under  an Emergency Use Authorization (EUA). This EUA will remain in effect (meaning this test can be used) for the duration of the COVID-19 declaration under Section 564(b)(1) of the Act, 21 U.S.C. section 360bbb-3(b)(1), unless the authorization is terminated or revoked.  Performed at [facility], [address], [place], [place] [address]      Radiological Exams on Admission: CT ABDOMEN PELVIS W CONTRAST  Result Date: 08/27/2020 CLINICAL DATA:  Acute generalized abdominal pain. EXAM: CT ABDOMEN AND PELVIS WITH CONTRAST TECHNIQUE: Multidetector CT imaging of the abdomen and pelvis was performed using the standard protocol following bolus administration of intravenous contrast. CONTRAST:  OMNIPAQUE IOHEXOL 300 MG/ML  SOLN COMPARISON:  None. FINDINGS: Lower chest: No acute abnormality. Hepatobiliary: No focal liver abnormality is seen. No gallstones, gallbladder wall thickening, or biliary dilatation. Pancreas: Unremarkable. No pancreatic ductal dilatation or surrounding inflammatory changes. Spleen: Normal in size without focal abnormality. Adrenals/Urinary Tract: Adrenal glands and kidneys appear normal. No hydronephrosis or renal obstruction is noted. No renal or ureteral calculi are noted. Urinary bladder is decompressed, but surrounding inflammation is noted concerning for cystitis. Stomach/Bowel: Stomach is within normal limits. Appendix appears normal. No evidence of bowel wall thickening, distention, or inflammatory changes. Vascular/Lymphatic: Aortic atherosclerosis. No enlarged abdominal or pelvic lymph nodes. Reproductive: Moderate prostatic enlargement is noted. Other: No abdominal wall hernia or abnormality. No abdominopelvic ascites. 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 No acute osseous abnormality is noted. IMPRESSION: 1. Urinary bladder is decompressed, but surrounding inflammation is noted concerning for cystitis. 2. Moderate  prostatic enlargement is noted. 3. Aortic atherosclerosis. Aortic Atherosclerosis (ICD10-I70.0). Electronically Signed   By: Lupita Raider M.D.   On: 08/27/2020 12:29   DG Chest Port 1 View  Result Date: 08/27/2020 CLINICAL DATA:  Chest pain and questionable sepsis EXAM: PORTABLE CHEST 1 VIEW COMPARISON:  None. FINDINGS: Cardiac shadow is enlarged. Central vascular congestion is noted. No significant CAD is seen. No focal infiltrate is noted. IMPRESSION: Mild central vascular congestion without definitive edema. Electronically Signed   By: Alcide Clever M.D.   On: 08/27/2020 11:20     EKG: I have personally reviewed.  Sinus rhythm, tachycardia, QTC 469, nonspecific T wave change.  Assessment/Plan Principal Problem:   UTI (urinary tract infection) Active Problems:   Sepsis (HCC)   Diabetes mellitus without complication (HCC)   Hypertension   Fall   Sepsis due to UTI (urinary tract infection): Patient meets criteria for sepsis with leukocytosis with WBC 34.3, tachycardia with heart rate of 117, tachypnea with RR 40.  Lactic acid is normal.  Currently hemodynamically stable  - will admit to med-surg bed as inpt -  Ceftriaxone by IV (  patient received 1 dose of vancomycin, cefepime, Flagyl in ED) - Follow up results of urine and blood cx and amend antibiotic regimen if needed per sensitivity results - prn Zofran for nausea - will get Procalcitonin and trend lactic acid levels per sepsis protocol. - IVF: 1.8L of NS bolus in ED, followed by 125 cc/h of LR  Diabetes mellitus without complication [facility](HCC): Recent A1c 5.9, well controlled. Patient still taking Metformin -SSI  Hypertension: Patient's not taking medications at home.  Blood pressure 131/65 -IV hydralazine as needed  Fall: Seem to be accidental fall.  No focal neuro deficit on physical examination.  Patient denies head or neck injury. No LOC. Refused CT scan of head and neck. -Observe closely      DVT ppx:  SQ Lovenox Code  Status: Full code Family Communication: not done, no family member is at bed side.     Disposition Plan:  Anticipate discharge back to previous environment Consults called:  none Admission status and Level of care: Med-Surg:  as inpt     Status is: Inpatient  Remains inpatient appropriate because:Inpatient level of care appropriate due to severity of illness. Pt has history of hypertension or diabetes mellitus.  He presents with sepsis due to UTI.  Patient has significant leukocytosis with WBC 34 and fever.   His presentation is highly complicated with patient is at high risk of deteriorating.  Need to be treated in hospital for at least 2 days.   Dispo: The patient is from: Home              Anticipated d/c is to: Home              Anticipated d/c date is: 2 days              Patient currently is not medically stable to d/c.   Difficult to place patient No          Date of Service 08/27/2020    Lorretta HarpXilin Jazen Spraggins [facility]   If 7PM-7AM, please contact night-coverage [email or web address] 08/27/2020, 2:24 PM

## 2020-08-28 ENCOUNTER — Inpatient Hospital Stay
Admit: 2020-08-28 | Discharge: 2020-08-28 | Disposition: A | Discharge status: Home / Self Care | Payer: BC Managed Care – PPO | Attending: Internal Medicine | Admitting: Internal Medicine

## 2020-08-28 DIAGNOSIS — E119 Type 2 diabetes mellitus without complications: Secondary | ICD-10-CM

## 2020-08-28 LAB — CBC
HCT: 32.6 % — ABNORMAL LOW (ref 39.0–52.0)
Hemoglobin: 10.6 g/dL — ABNORMAL LOW (ref 13.0–17.0)
MCH: 31.6 pg (ref 26.0–34.0)
MCHC: 32.5 g/dL (ref 30.0–36.0)
MCV: 97.3 fL (ref 80.0–100.0)
Platelets: 251 10*3/uL (ref 150–400)
RBC: 3.35 MIL/uL — ABNORMAL LOW (ref 4.22–5.81)
RDW: 12.8 % (ref 11.5–15.5)
WBC: 31.1 10*3/uL — ABNORMAL HIGH (ref 4.0–10.5)
nRBC: 0 % (ref 0.0–0.2)

## 2020-08-28 LAB — BASIC METABOLIC PANEL
Anion gap: 11 (ref 5–15)
BUN: 15 mg/dL (ref 8–23)
CO2: 25 mmol/L (ref 22–32)
Calcium: 9 mg/dL (ref 8.9–10.3)
Chloride: 104 mmol/L (ref 98–111)
Creatinine, Ser: 0.85 mg/dL (ref 0.61–1.24)
GFR, Estimated: >60 mL/min (ref >60)
Glucose, Bld: 132 mg/dL — ABNORMAL HIGH (ref 70–99)
Potassium: 3.6 mmol/L (ref 3.5–5.1)
Sodium: 140 mmol/L (ref 135–145)

## 2020-08-28 LAB — GLUCOSE, CAPILLARY
Glucose-Capillary: 127 mg/dL — ABNORMAL HIGH (ref 70–99)
Glucose-Capillary: 103 mg/dL — ABNORMAL HIGH (ref 70–99)
Glucose-Capillary: 90 mg/dL (ref 70–99)
Glucose-Capillary: 122 mg/dL — ABNORMAL HIGH (ref 70–99)

## 2020-08-28 LAB — URINE CULTURE: Culture: NO GROWTH

## 2020-08-28 LAB — BRAIN NATRIURETIC PEPTIDE: B Natriuretic Peptide: 278.7 pg/mL — ABNORMAL HIGH (ref 0.0–100.0)

## 2020-08-28 MED ORDER — HYDRALAZINE HCL 20 MG/ML IJ SOLN: 10.0000 mg | INTRAMUSCULAR | Status: DC | PRN

## 2020-08-28 MED ORDER — PERFLUTREN LIPID MICROSPHERE
1.0000 mL | INTRAVENOUS | Status: AC | PRN
  Administered 2020-08-28: 6 mL via INTRAVENOUS
  Filled 2020-08-28: qty 10

## 2020-08-28 MED ORDER — AMLODIPINE BESYLATE 5 MG PO TABS
5.0000 mg | ORAL_TABLET | Freq: Every day | ORAL | Status: DC
  Administered 2020-08-28 – 2020-08-29 (×2): 5 mg via ORAL
  Filled 2020-08-28 (×2): qty 1

## 2020-08-28 MED ORDER — IPRATROPIUM-ALBUTEROL 0.5-2.5 (3) MG/3ML IN SOLN: 3.0000 mL | RESPIRATORY_TRACT | Status: DC | PRN

## 2020-08-28 NOTE — Progress Notes (Signed)
*  PRELIMINARY RESULTS* Echocardiogram 2D Echocardiogram has been performed. Definity IV Contrast used on this study.  Garrel Ridgel Benjamyn Hestand 08/28/2020, 2:08 PM

## 2020-08-28 NOTE — Progress Notes (Signed)
PROGRESS NOTE    Daniel Trevino  KVQ:[id number] DOB: Jun 13, 1951 DOA: 08/27/2020 PCP: Patient, No Pcp Per   Brief Narrative:  70 y.o. male with medical history significant of hypertension, diabetes mellitus, who presents with fever, dysuria and abdominal pain.  Patient states that he started having dysuria, increased urinary frequency, burning on urination since yesterday.  He also complains of suprapubic abdominal pain which is constant, mild to moderate, aching, nonradiating.  No hematuria. He also has fever and chills.  Patient denies chest pain, shortness breath, cough to me.  No nausea, vomiting, diarrhea.  Patient states that he has generalized weakness.  He was seen in UC and was sent to ED. He states that while he was waiting at the outside of clinic, he fell accidentally.  He strongly denies any head or neck injury.  Denies loss of consciousness or passing out.  He refused CT scan of head and neck.  Patient was started on empiric antibiotics for cystitis.  Imaging does not reveal any evidence of ascending UTI or pyelonephritis.  Patient mains hemodynamically stable though somewhat hypertensive, tachycardic, tachypneic.   Assessment & Plan:   Principal Problem:   UTI (urinary tract infection) Active Problems:   Sepsis (HCC)   Diabetes mellitus without complication (HCC)   Hypertension   Fall  Sepsis due to UTI (urinary tract infection) Patient meets criteria for sepsis with leukocytosis with WBC 34.3, tachycardia with heart rate of 117, tachypnea with RR 40.   Lactic acid is normal.   Currently hemodynamically stable Sepsis physiology improving Plan: Continue Rocephin Hold IV fluids at this time considering vascular congestion seen on x-ray Follow blood and urine culture for pathogen identification and de-escalation of antibiotic Monitor fever curve Daily CBC  Diabetes mellitus without complication  Recent A1c 5.9, well controlled.  Patient still taking  Metformin Oral agents on hold -SSI  Hypertension  Patient's not taking medications at home.   Blood pressure poorly controlled Start amlodipine 5 mg daily Continue IV hydralazine as needed  Fall  Seem to be accidental fall.   No focal neuro deficit on physical examination.   Patient denies head or neck injury. No LOC.  Refused CT scan of head and neck. -Observe closely  Morbid obesity BMI 51 Complicates overall care and prognosis   DVT prophylaxis: SQ Lovenox Code Status: Full Family Communication: None today.  Offered to call family but patient declined Disposition Plan: Status is: Inpatient  Remains inpatient appropriate because:Inpatient level of care appropriate due to severity of illness   Dispo: The patient is from: Home              Anticipated d/c is to: Home              Anticipated d/c date is: 1 day              Patient currently is not medically stable to d/c.   Difficult to place patient No  Complicated urinary tract infection in a male.  On empiric IV antibiotics for now.  Following cultures for pathogen identification and de-escalation of antibiotics.  If acceptable oral lesions found and patient remains hemodynamically stable possible discharge home on 08/29/2020     Level of care: Med-Surg  Consultants:   None  Procedures:   None  Antimicrobials:   Ceftriaxone   Subjective: Patient seen and examined.  Reports improvement in symptoms since admission.  No pain complaints.  Objective: Vitals:   08/27/20 2303 08/28/20 6433 08/28/20 0515 08/28/20 0818  BP: (!) 151/81 (!) 124/57 (!) 147/71 (!) 169/63  Pulse: (!) 110 (!) 111 (!) 110 (!) 109  Resp: (!) 26 (!) 28 (!) 28 (!) 30  Temp: 98.2 F (36.8 C) 98 F (36.7 C) 99 F (37.2 C) 99 F (37.2 C)  TempSrc: Oral Oral Oral Oral  SpO2: 97% 97% 97% 97%  Weight:      Height:        Intake/Output Summary (Last 24 hours) at 08/28/2020 1028 Last data filed at 08/28/2020 0606 Gross per 24 hour   Intake 2026.94 ml  Output 300 ml  Net 1726.94 ml   Filed Weights   08/27/20 1054  Weight: 127 kg    Examination:  General exam: Appears calm and comfortable  Respiratory system: Mild scattered crackles bilaterally.  Normal work of breathing.  Room air Cardiovascular system: S1 & S2 heard, RRR. No JVD, murmurs, rubs, gallops or clicks. No pedal edema. Gastrointestinal system: Obese, nontender, nondistended, normal bowel sounds Central nervous system: Alert and oriented. No focal neurological deficits. Extremities: Symmetric 5 x 5 power. Skin: No rashes, lesions or ulcers Psychiatry: Judgement and insight appear normal. Mood & affect appropriate.     Data Reviewed: I have personally reviewed following labs and imaging studies  CBC: Recent Labs  Lab 08/27/20 0930 08/27/20 1105 08/28/20 0429  WBC 33.8* 34.3* 31.1*  NEUTROABS 29.6* 30.3*  --   HGB 11.8* 11.6* 10.6*  HCT 35.5* 34.9* 32.6*  MCV 95.2 96.1 97.3  PLT 286 291 251   Basic Metabolic Panel: Recent Labs  Lab 08/27/20 0930 08/27/20 1105 08/28/20 0429  NA 137 135 140  K 3.9 3.6 3.6  CL 98 100 104  CO2 26 24 25   GLUCOSE 193* 176* 132*  BUN 14 14 15   CREATININE 1.08 1.04 0.85  CALCIUM 9.6 9.5 9.0   GFR: Estimated Creatinine Clearance: 97 mL/min (by C-G formula based on SCr of 0.85 mg/dL). Liver Function Tests: Recent Labs  Lab 08/27/20 0930 08/27/20 1105  AST 26 26  ALT 20 18  ALKPHOS 55 52  BILITOT 2.2* 1.8*  PROT 8.8* 8.2*  ALBUMIN 3.9 3.9   No results for input(s): LIPASE, AMYLASE in the last 168 hours. No results for input(s): AMMONIA in the last 168 hours. Coagulation Profile: Recent Labs  Lab 08/27/20 1105  INR 1.2   Cardiac Enzymes: No results for input(s): CKTOTAL, CKMB, CKMBINDEX, TROPONINI in the last 168 hours. BNP (last 3 results) No results for input(s): PROBNP in the last 8760 hours. HbA1C: No results for input(s): HGBA1C in the last 72 hours. CBG: Recent Labs  Lab  08/27/20 1700 08/27/20 2036 08/28/20 0742  GLUCAP 136* 144* 127*   Lipid Profile: No results for input(s): CHOL, HDL, LDLCALC, TRIG, CHOLHDL, LDLDIRECT in the last 72 hours. Thyroid Function Tests: No results for input(s): TSH, T4TOTAL, FREET4, T3FREE, THYROIDAB in the last 72 hours. Anemia Panel: No results for input(s): VITAMINB12, FOLATE, FERRITIN, TIBC, IRON, RETICCTPCT in the last 72 hours. Sepsis Labs: Recent Labs  Lab 08/27/20 1105  PROCALCITON 2.63  LATICACIDVEN 1.9    Recent Results (from the past 240 hour(s))  Resp Panel by RT-PCR (Flu A&B, Covid) Nasopharyngeal Swab     Status: None   Collection Time: 08/27/20  9:30 AM   Specimen: Nasopharyngeal Swab; Nasopharyngeal(NP) swabs in vial transport medium  Result Value Ref Range Status   SARS Coronavirus 2 by RT PCR NEGATIVE NEGATIVE Final    Comment: (NOTE) SARS-CoV-2 target nucleic acids are NOT DETECTED.  The SARS-CoV-2 RNA is generally detectable in upper respiratory specimens during the acute phase of infection. The lowest concentration of SARS-CoV-2 viral copies this assay can detect is 138 copies/mL. A negative result does not preclude SARS-Cov-2 infection and should not be used as the sole basis for treatment or other patient management decisions. A negative result may occur with  improper specimen collection/handling, submission of specimen other than nasopharyngeal swab, presence of viral mutation(s) within the areas targeted by this assay, and inadequate number of viral copies(<138 copies/mL). A negative result must be combined with clinical observations, patient history, and epidemiological information. The expected result is Negative.  Fact Sheet for Patients:  BloggerCourse.comhttps://www.fda.gov/media/152166/download  Fact Sheet for Healthcare Providers:  SeriousBroker.ithttps://www.fda.gov/media/152162/download  This test is no t yet approved or cleared by the [place] FDA and  has been authorized for detection and/or  diagnosis of SARS-CoV-2 by FDA under an Emergency Use Authorization (EUA). This EUA will remain  in effect (meaning this test can be used) for the duration of the COVID-19 declaration under Section 564(b)(1) of the Act, 21 U.S.C.section 360bbb-3(b)(1), unless the authorization is terminated  or revoked sooner.       Influenza A by PCR NEGATIVE NEGATIVE Final   Influenza B by PCR NEGATIVE NEGATIVE Final    Comment: (NOTE) The Xpert Xpress SARS-CoV-2/FLU/RSV plus assay is intended as an aid in the diagnosis of influenza from Nasopharyngeal swab specimens and should not be used as a sole basis for treatment. Nasal washings and aspirates are unacceptable for Xpert Xpress SARS-CoV-2/FLU/RSV testing.  Fact Sheet for Patients: BloggerCourse.comhttps://www.fda.gov/media/152166/download  Fact Sheet for Healthcare Providers: SeriousBroker.ithttps://www.fda.gov/media/152162/download  This test is not yet approved or cleared by the [place] FDA and has been authorized for detection and/or diagnosis of SARS-CoV-2 by FDA under an Emergency Use Authorization (EUA). This EUA will remain in effect (meaning this test can be used) for the duration of the COVID-19 declaration under Section 564(b)(1) of the Act, 21 U.S.C. section 360bbb-3(b)(1), unless the authorization is terminated or revoked.  Performed at [facility], [address] [place], [place] [address]   Resp Panel by RT-PCR (Flu A&B, Covid) Nasopharyngeal Swab     Status: None   Collection Time: 08/27/20 11:05 AM   Specimen: Nasopharyngeal Swab; Nasopharyngeal(NP) swabs in vial transport medium  Result Value Ref Range Status   SARS Coronavirus 2 by RT PCR NEGATIVE NEGATIVE Final    Comment: (NOTE) SARS-CoV-2 target nucleic acids are NOT DETECTED.  The SARS-CoV-2 RNA is generally detectable in upper respiratory specimens during the acute phase of infection. The lowest concentration of SARS-CoV-2 viral copies this assay can detect is 138  copies/mL. A negative result does not preclude SARS-Cov-2 infection and should not be used as the sole basis for treatment or other patient management decisions. A negative result may occur with  improper specimen collection/handling, submission of specimen other than nasopharyngeal swab, presence of viral mutation(s) within the areas targeted by this assay, and inadequate number of viral copies(<138 copies/mL). A negative result must be combined with clinical observations, patient history, and epidemiological information. The expected result is Negative.  Fact Sheet for Patients:  BloggerCourse.comhttps://www.fda.gov/media/152166/download  Fact Sheet for Healthcare Providers:  SeriousBroker.ithttps://www.fda.gov/media/152162/download  This test is no t yet approved or cleared by the [place] FDA and  has been authorized for detection and/or diagnosis of SARS-CoV-2 by FDA under an Emergency Use Authorization (EUA). This EUA will remain  in effect (meaning this test can be used) for the duration of  the COVID-19 declaration under Section 564(b)(1) of the Act, 21 U.S.C.section 360bbb-3(b)(1), unless the authorization is terminated  or revoked sooner.       Influenza A by PCR NEGATIVE NEGATIVE Final   Influenza B by PCR NEGATIVE NEGATIVE Final    Comment: (NOTE) The Xpert Xpress SARS-CoV-2/FLU/RSV plus assay is intended as an aid in the diagnosis of influenza from Nasopharyngeal swab specimens and should not be used as a sole basis for treatment. Nasal washings and aspirates are unacceptable for Xpert Xpress SARS-CoV-2/FLU/RSV testing.  Fact Sheet for Patients: BloggerCourse.com  Fact Sheet for Healthcare Providers: SeriousBroker.it  This test is not yet approved or cleared by the [place] FDA and has been authorized for detection and/or diagnosis of SARS-CoV-2 by FDA under an Emergency Use Authorization (EUA). This EUA will remain in effect (meaning  this test can be used) for the duration of the COVID-19 declaration under Section 564(b)(1) of the Act, 21 U.S.C. section 360bbb-3(b)(1), unless the authorization is terminated or revoked.  Performed at [facility], [address] [place], [place] [address]   Blood Culture (routine x 2)     Status: None (Preliminary result)   Collection Time: 08/27/20 11:05 AM   Specimen: BLOOD  Result Value Ref Range Status   Specimen Description BLOOD BLOOD RIGHT FOREARM  Final   Special Requests   Final    BOTTLES DRAWN AEROBIC AND ANAEROBIC Blood Culture results may not be optimal due to an excessive volume of blood received in culture bottles   Culture   Final    NO GROWTH < 24 HOURS Performed at [facility], [address], [place], [place] [address]    Report Status PENDING  Incomplete  Blood Culture (routine x 2)     Status: None (Preliminary result)   Collection Time: 08/27/20 11:05 AM   Specimen: BLOOD  Result Value Ref Range Status   Specimen Description BLOOD BLOOD RIGHT FOREARM  Final   Special Requests   Final    BOTTLES DRAWN AEROBIC AND ANAEROBIC Blood Culture adequate volume   Culture   Final    NO GROWTH < 24 HOURS Performed at [facility], [address], [place], [place] [address]    Report Status PENDING  Incomplete         Radiology Studies: CT ABDOMEN PELVIS W CONTRAST  Result Date: 08/27/2020 CLINICAL DATA:  Acute generalized abdominal pain. EXAM: CT ABDOMEN AND PELVIS WITH CONTRAST TECHNIQUE: Multidetector CT imaging of the abdomen and pelvis was performed using the standard protocol following bolus administration of intravenous contrast. CONTRAST:  OMNIPAQUE IOHEXOL 300 MG/ML  SOLN COMPARISON:  None. FINDINGS: Lower chest: No acute abnormality. Hepatobiliary: No focal liver abnormality is seen. No gallstones, gallbladder wall thickening, or biliary dilatation. Pancreas: Unremarkable. No pancreatic ductal dilatation or  surrounding inflammatory changes. Spleen: Normal in size without focal abnormality. Adrenals/Urinary Tract: Adrenal glands and kidneys appear normal. No hydronephrosis or renal obstruction is noted. No renal or ureteral calculi are noted. Urinary bladder is decompressed, but surrounding inflammation is noted concerning for cystitis. Stomach/Bowel: Stomach is within normal limits. Appendix appears normal. No evidence of bowel wall thickening, distention, or inflammatory changes. Vascular/Lymphatic: Aortic atherosclerosis. No enlarged abdominal or pelvic lymph nodes. Reproductive: Moderate prostatic enlargement is noted. Other: No abdominal wall hernia or abnormality. No abdominopelvic ascites. 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 No acute osseous abnormality is noted. IMPRESSION: 1. Urinary bladder is decompressed, but surrounding inflammation is noted  concerning for cystitis. 2. Moderate prostatic enlargement is noted. 3. Aortic atherosclerosis. Aortic Atherosclerosis (ICD10-I70.0). Electronically Signed   By: Lupita Raider M.D.   On: 08/27/2020 12:29   DG Chest Port 1 View  Result Date: 08/27/2020 CLINICAL DATA:  Chest pain and questionable sepsis EXAM: PORTABLE CHEST 1 VIEW COMPARISON:  None. FINDINGS: Cardiac shadow is enlarged. Central vascular congestion is noted. No significant CAD is seen. No focal infiltrate is noted. IMPRESSION: Mild central vascular congestion without definitive edema. Electronically Signed   By: Alcide Clever M.D.   On: 08/27/2020 11:20        Scheduled Meds: . amLODipine  5 mg Oral Daily  . aspirin EC  81 mg Oral Daily  . enoxaparin (LOVENOX) injection  0.5 mg/kg Subcutaneous Q24H  . insulin aspart  0-5 Units Subcutaneous QHS  . insulin aspart  0-9 Units Subcutaneous TID WC  . multivitamin with minerals  1 tablet Oral Daily   Continuous Infusions: . cefTRIAXone (ROCEPHIN)  IV Stopped (08/27/20 1634)     LOS: 1 day     Time spent: 25 minutes    Tresa Moore, MD [facility] Pager [phone]  If 7PM-7AM, please contact night-coverage 08/28/2020, 10:28 AM

## 2020-08-29 LAB — BASIC METABOLIC PANEL
Anion gap: 11 (ref 5–15)
BUN: 16 mg/dL (ref 8–23)
CO2: 27 mmol/L (ref 22–32)
Calcium: 8.8 mg/dL — ABNORMAL LOW (ref 8.9–10.3)
Chloride: 102 mmol/L (ref 98–111)
Creatinine, Ser: 0.82 mg/dL (ref 0.61–1.24)
GFR, Estimated: >60 mL/min (ref >60)
Glucose, Bld: 101 mg/dL — ABNORMAL HIGH (ref 70–99)
Potassium: 3.4 mmol/L — ABNORMAL LOW (ref 3.5–5.1)
Sodium: 140 mmol/L (ref 135–145)

## 2020-08-29 LAB — CBC WITH DIFFERENTIAL/PLATELET
Abs Immature Granulocytes: 0.16 10*3/uL — ABNORMAL HIGH (ref 0.00–0.07)
Basophils Absolute: 0.1 10*3/uL (ref 0.0–0.1)
Basophils Relative: 0 %
Eosinophils Absolute: 0.2 10*3/uL (ref 0.0–0.5)
Eosinophils Relative: 1 %
HCT: 34.4 % — ABNORMAL LOW (ref 39.0–52.0)
Hemoglobin: 11.4 g/dL — ABNORMAL LOW (ref 13.0–17.0)
Immature Granulocytes: 1 %
Lymphocytes Relative: 5 %
Lymphs Abs: 1.2 10*3/uL (ref 0.7–4.0)
MCH: 32.3 pg (ref 26.0–34.0)
MCHC: 33.1 g/dL (ref 30.0–36.0)
MCV: 97.5 fL (ref 80.0–100.0)
Monocytes Absolute: 1.3 10*3/uL — ABNORMAL HIGH (ref 0.1–1.0)
Monocytes Relative: 6 %
Neutro Abs: 19 10*3/uL — ABNORMAL HIGH (ref 1.7–7.7)
Neutrophils Relative %: 87 %
Platelets: 280 10*3/uL (ref 150–400)
RBC: 3.53 MIL/uL — ABNORMAL LOW (ref 4.22–5.81)
RDW: 12.9 % (ref 11.5–15.5)
WBC: 21.9 10*3/uL — ABNORMAL HIGH (ref 4.0–10.5)
nRBC: 0 % (ref 0.0–0.2)

## 2020-08-29 LAB — ECHOCARDIOGRAM COMPLETE
Area-P 1/2: 3.72 cm2
Height: 62 in
S' Lateral: 3.59 cm
Weight: 4479.75 oz

## 2020-08-29 LAB — GLUCOSE, CAPILLARY
Glucose-Capillary: 88 mg/dL (ref 70–99)
Glucose-Capillary: 103 mg/dL — ABNORMAL HIGH (ref 70–99)

## 2020-08-29 MED ORDER — AMOXICILLIN-POT CLAVULANATE 875-125 MG PO TABS: 1.0000 | ORAL_TABLET | Freq: Two times a day (BID) | ORAL | 0 refills | Status: AC

## 2020-08-29 MED ORDER — SODIUM CHLORIDE 0.9 % IV SOLN
INTRAVENOUS | Status: DC | PRN
  Administered 2020-08-29: 250 mL via INTRAVENOUS

## 2020-08-29 MED ORDER — AMLODIPINE BESYLATE 5 MG PO TABS: 5.0000 mg | ORAL_TABLET | Freq: Every day | ORAL | 0 refills | Status: AC

## 2020-08-29 NOTE — Discharge Instructions (Signed)
Urinary Tract Infection, Adult  A urinary tract infection (UTI) is an infection of any part of the urinary tract. The urinary tract includes the kidneys, ureters, bladder, and urethra. These organs make, store, and get rid of urine in the body. An upper UTI affects the ureters and kidneys. A lower UTI affects the bladder and urethra. What are the causes? Most urinary tract infections are caused by bacteria in your genital area around your urethra, where urine leaves your body. These bacteria grow and cause inflammation of your urinary tract. What increases the risk? You are more likely to develop this condition if:  You have a urinary catheter that stays in place.  You are not able to control when you urinate or have a bowel movement (incontinence).  You are male and you: ? Use a spermicide or diaphragm for birth control. ? Have low estrogen levels. ? Are pregnant.  You have certain genes that increase your risk.  You are sexually active.  You take antibiotic medicines.  You have a condition that causes your flow of urine to slow down, such as: ? An enlarged prostate, if you are male. ? Blockage in your urethra. ? A kidney stone. ? A nerve condition that affects your bladder control (neurogenic bladder). ? Not getting enough to drink, or not urinating often.  You have certain medical conditions, such as: ? Diabetes. ? A weak disease-fighting system (immunesystem). ? Sickle cell disease. ? Gout. ? Spinal cord injury. What are the signs or symptoms? Symptoms of this condition include:  Needing to urinate right away (urgency).  Frequent urination. This may include small amounts of urine each time you urinate.  Pain or burning with urination.  Blood in the urine.  Urine that smells bad or unusual.  Trouble urinating.  Cloudy urine.  Vaginal discharge, if you are male.  Pain in the abdomen or the lower back. You may also have:  Vomiting or a decreased  appetite.  Confusion.  Irritability or tiredness.  A fever or chills.  Diarrhea. The first symptom in older adults may be confusion. In some cases, they may not have any symptoms until the infection has worsened. How is this diagnosed? This condition is diagnosed based on your medical history and a physical exam. You may also have other tests, including:  Urine tests.  Blood tests.  Tests for STIs (sexually transmitted infections). If you have had more than one UTI, a cystoscopy or imaging studies may be done to determine the cause of the infections. How is this treated? Treatment for this condition includes:  Antibiotic medicine.  Over-the-counter medicines to treat discomfort.  Drinking enough water to stay hydrated. If you have frequent infections or have other conditions such as a kidney stone, you may need to see a health care provider who specializes in the urinary tract (urologist). In rare cases, urinary tract infections can cause sepsis. Sepsis is a life-threatening condition that occurs when the body responds to an infection. Sepsis is treated in the hospital with IV antibiotics, fluids, and other medicines. Follow these instructions at home: Medicines  Take over-the-counter and prescription medicines only as told by your health care provider.  If you were prescribed an antibiotic medicine, take it as told by your health care provider. Do not stop using the antibiotic even if you start to feel better. General instructions  Make sure you: ? Empty your bladder often and completely. Do not hold urine for long periods of time. ? Empty your bladder after   sex. ? Wipe from front to back after urinating or having a bowel movement if you are male. Use each tissue only one time when you wipe.  Drink enough fluid to keep your urine pale yellow.  Keep all follow-up visits. This is important.   Contact a health care provider if:  Your symptoms do not get better after 1-2  days.  Your symptoms go away and then return. Get help right away if:  You have severe pain in your back or your lower abdomen.  You have a fever or chills.  You have nausea or vomiting. Summary  A urinary tract infection (UTI) is an infection of any part of the urinary tract, which includes the kidneys, ureters, bladder, and urethra.  Most urinary tract infections are caused by bacteria in your genital area.  Treatment for this condition often includes antibiotic medicines.  If you were prescribed an antibiotic medicine, take it as told by your health care provider. Do not stop using the antibiotic even if you start to feel better.  Keep all follow-up visits. This is important. This information is not intended to replace advice given to you by your health care provider. Make sure you discuss any questions you have with your health care provider. Document Revised: 02/13/2020 Document Reviewed: 02/13/2020 Elsevier Patient Education  2021 Elsevier Inc.  

## 2020-08-29 NOTE — Progress Notes (Signed)
Daniel Trevino to be D/C'd home per MD order.  Discussed prescriptions and follow up appointments with the patient. Prescriptions given to patient, medication list explained in detail. Pt verbalized understanding.  Allergies as of 08/29/2020   No Known Allergies      Medication List     TAKE these medications    amLODipine 5 MG tablet Commonly known as: NORVASC Take 1 tablet (5 mg total) by mouth daily. Start taking on: August 30, 2020   amoxicillin-clavulanate 875-125 MG tablet Commonly known as: Augmentin Take 1 tablet by mouth 2 (two) times daily for 7 days.   aspirin 81 MG EC tablet Take by mouth.   metFORMIN 500 MG tablet Commonly known as: GLUCOPHAGE Take 500 mg by mouth 2 (two) times daily.   Multi-Vitamin tablet Take 1 tablet by mouth daily.        Vitals:   08/29/20 1137 08/29/20 1531  BP: (!) 157/84 (!) 130/58  Pulse: 91 100  Resp: 20 20  Temp: 98.9 F (37.2 C) 99.7 F (37.6 C)  SpO2: 100% 96%    Skin clean, dry and intact without evidence of skin break down, no evidence of skin tears noted. IV catheter discontinued intact. Site without signs and symptoms of complications. Dressing and pressure applied. Pt denies pain at this time. No complaints noted.  An After Visit Summary was printed and given to the patient. Patient escorted via WC, and D/C home via private auto.  Eaton Folmar A Jodel Mayhall

## 2020-08-29 NOTE — Discharge Summary (Signed)
Physician Discharge Summary  Daniel Trevino GNF:[id number] DOB: 1950-12-16 DOA: 08/27/2020  PCP: Patient, No Pcp Per  Admit date: 08/27/2020 Discharge date: 08/29/2020  Admitted From: Home Disposition: Home  Recommendations for Outpatient Follow-up:  1. Follow up with PCP in 1-2 weeks   Home Health: No Equipment/Devices: None Discharge Condition: Stable CODE STATUS: Full Diet recommendation: Heart Healthy  Brief/Interim Summary: 70 y.o.malewith medical history significant ofhypertension, diabetes mellitus, who presents with fever,dysuria and abdominal pain.  Patient states that he started having dysuria, increased urinary frequency, burning on urination since yesterday.He also complains of suprapubic abdominal pain which is constant, mild to moderate, aching, nonradiating. No hematuria. He also has fever and chills. Patient denies chest pain, shortness breath, cough to me. No nausea,vomiting, diarrhea.Patient states that he has generalized weakness. He was seen in UC and was sent to ED. He states that while he was waiting at the outside of clinic, he fell accidentally. He strongly denies any head or neck injury. Denies loss of consciousness or passing out. He refused CT scan of head and neck.  Patient was started on empiric antibiotics for cystitis.  Imaging does not reveal any evidence of ascending UTI or pyelonephritis.  Patient mains hemodynamically stable though somewhat hypertensive, tachycardic, tachypneic.  Sepsis related symptoms resolved the time of discharge.  Interestingly urine culture showed no growth.  Significance of this is unclear however considering urinalysis convincing for infection and sepsis symptoms will treat empirically for complicated UTI in male.  On day of discharge patient ambulated without issue.  Stable for discharge home at this time.  Antibiotics prescribed on discharge.  Follow-up outpatient PCP.  Discharge Diagnoses:  Principal Problem:    UTI (urinary tract infection) Active Problems:   Sepsis (HCC)   Diabetes mellitus without complication (HCC)   Hypertension   Fall  Sepsis due toUTI (urinary tract infection) Patient meets criteria for sepsis with leukocytosis with WBC 34.3, tachycardia with heart rate of 117, tachypnea with RR 40.  Lactic acid is normal.  Currently hemodynamically stable Sepsis physiology improving Downtrending leukocytosis on time of discharge No fevers for 24 hours plus prior to discharge Urine culture no growth, unclear significance.  Possible that patient was exposed antibiotics prior to urinalysis or urine culture being drawn Treat empirically with Augmentin 875/125 x 7 days Follow-up outpatient PCP  Diabetes mellitus without complication Recent A1c 5.9,well controlled.  Patient still taking Metformin Can resume on discharge   Hypertension Patient's not taking medications at home.  Blood pressure poorly controlled Start amlodipine 5 mg daily, prescribed on discharge Follow-up outpatient PCP  Fall  Seem to be accidental fall. No focal neurodeficit on physical examination.  Patient denies head or neck injury. No LOC. Refused CT scan of head and neck. Stable at time of discharge  Morbid obesity BMI 51 Complicates overall care and prognosis  Discharge Instructions  Discharge Instructions    Diet - low sodium heart healthy   Complete by: As directed    Increase activity slowly   Complete by: As directed      Allergies as of 08/29/2020   No Known Allergies     Medication List    TAKE these medications   amLODipine 5 MG tablet Commonly known as: NORVASC Take 1 tablet (5 mg total) by mouth daily. Start taking on: August 30, 2020   amoxicillin-clavulanate 875-125 MG tablet Commonly known as: Augmentin Take 1 tablet by mouth 2 (two) times daily for 7 days.   aspirin 81 MG EC tablet Take by  mouth.   metFORMIN 500 MG tablet Commonly known as:  GLUCOPHAGE Take 500 mg by mouth 2 (two) times daily.   Multi-Vitamin tablet Take 1 tablet by mouth daily.       No Known Allergies  Consultations: None  Procedures/Studies: CT ABDOMEN PELVIS W CONTRAST  Result Date: 08/27/2020 CLINICAL DATA:  Acute generalized abdominal pain. EXAM: CT ABDOMEN AND PELVIS WITH CONTRAST TECHNIQUE: Multidetector CT imaging of the abdomen and pelvis was performed using the standard protocol following bolus administration of intravenous contrast. CONTRAST:  OMNIPAQUE IOHEXOL 300 MG/ML  SOLN COMPARISON:  None. FINDINGS: Lower chest: No acute abnormality. Hepatobiliary: No focal liver abnormality is seen. No gallstones, gallbladder wall thickening, or biliary dilatation. Pancreas: Unremarkable. No pancreatic ductal dilatation or surrounding inflammatory changes. Spleen: Normal in size without focal abnormality. Adrenals/Urinary Tract: Adrenal glands and kidneys appear normal. No hydronephrosis or renal obstruction is noted. No renal or ureteral calculi are noted. Urinary bladder is decompressed, but surrounding inflammation is noted concerning for cystitis. Stomach/Bowel: Stomach is within normal limits. Appendix appears normal. No evidence of bowel wall thickening, distention, or inflammatory changes. Vascular/Lymphatic: Aortic atherosclerosis. No enlarged abdominal or pelvic lymph nodes. Reproductive: Moderate prostatic enlargement is noted. Other: No abdominal wall hernia or abnormality. No abdominopelvic ascites. 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 No acute osseous abnormality is noted. IMPRESSION: 1. Urinary bladder is decompressed, but surrounding inflammation is noted concerning for cystitis. 2. Moderate prostatic enlargement is noted. 3. Aortic atherosclerosis. Aortic Atherosclerosis (ICD10-I70.0). Electronically Signed   By: Lupita Raider M.D.   On: 08/27/2020 12:29   DG Chest Port 1 View  Result Date:  08/27/2020 CLINICAL DATA:  Chest pain and questionable sepsis EXAM: PORTABLE CHEST 1 VIEW COMPARISON:  None. FINDINGS: Cardiac shadow is enlarged. Central vascular congestion is noted. No significant CAD is seen. No focal infiltrate is noted. IMPRESSION: Mild central vascular congestion without definitive edema. Electronically Signed   By: Alcide Clever M.D.   On: 08/27/2020 11:20   ECHOCARDIOGRAM COMPLETE  Result Date: 08/29/2020    ECHOCARDIOGRAM REPORT   Patient Name:   Daniel Trevino Date of Exam: 08/28/2020 Medical Rec #:  [id number]      Height:       62.0 in Accession #:    [id number]     Weight:       280.0 lb Date of Birth:  Mar 22, 1951     BSA:          2.206 m Patient Age:    69 years       BP:           159/80 mmHg Patient Gender: M              HR:           103 bpm. Exam Location:  [facility] Procedure: 2D Echo and Intracardiac Opacification Agent Indications:     CHF I50.31  History:         Patient has no prior history of Echocardiogram examinations.  Sonographer:     Wonda Cerise RDCS Referring Phys:  [id number] Tresa Moore Diagnosing Phys: Harold Hedge MD  Sonographer Comments: Technically difficult study due to poor echo windows, suboptimal apical window and patient is morbidly obese. Image acquisition challenging due to patient body habitus. IMPRESSIONS  1. Left ventricular ejection fraction, by estimation, is 60 to 65%. The left ventricle has normal function. The left ventricle has no regional wall motion abnormalities. There is mild  left ventricular hypertrophy. Left ventricular diastolic parameters are consistent with Grade I diastolic dysfunction (impaired relaxation).  2. Right ventricular systolic function is normal. The right ventricular size is normal.  3. The mitral valve was not well visualized. Trivial mitral valve regurgitation.  4. The aortic valve was not well visualized. Aortic valve regurgitation is trivial. No aortic stenosis is present.  5. Pulmonic valve regurgitation not  able to visualized. FINDINGS  Left Ventricle: Left ventricular ejection fraction, by estimation, is 60 to 65%. The left ventricle has normal function. The left ventricle has no regional wall motion abnormalities. Definity contrast agent was given IV to delineate the left ventricular  endocardial borders. The left ventricular internal cavity size was normal in size. There is mild left ventricular hypertrophy. Left ventricular diastolic parameters are consistent with Grade I diastolic dysfunction (impaired relaxation). Right Ventricle: The right ventricular size is normal. No increase in right ventricular wall thickness. Right ventricular systolic function is normal. Left Atrium: Left atrial size was normal in size. Right Atrium: Right atrial size was normal in size. Pericardium: There is no evidence of pericardial effusion. Mitral Valve: The mitral valve was not well visualized. Trivial mitral valve regurgitation. Tricuspid Valve: The tricuspid valve is not well visualized. Tricuspid valve regurgitation is trivial. Aortic Valve: The aortic valve was not well visualized. Aortic valve regurgitation is trivial. No aortic stenosis is present. Pulmonic Valve: The pulmonic valve was not assessed. Pulmonic valve regurgitation not able to visualized. Aorta: The aortic root is normal in size and structure. IAS/Shunts: The atrial septum is grossly normal.  LEFT VENTRICLE PLAX 2D LVIDd:         5.92 cm  Diastology LVIDs:         3.59 cm  LV e' medial:   8.38 cm/s LV PW:         1.24 cm  LV E/e' medial: 8.9 LV IVS:        1.04 cm LVOT diam:     2.00 cm LV SV:         42 LV SV Index:   19 LVOT Area:     3.14 cm  LEFT ATRIUM           Index LA diam:      3.00 cm 1.36 cm/m LA Vol (A4C): 76.6 ml 34.73 ml/m  AORTIC VALVE LVOT Vmax:   88.80 cm/s LVOT Vmean:  55.700 cm/s LVOT VTI:    0.133 m  AORTA Ao Root diam: 3.20 cm Ao Asc diam:  3.10 cm MITRAL VALVE MV Area (PHT): 3.72 cm    SHUNTS MV Decel Time: 204 msec    Systemic VTI:   0.13 m MV E velocity: 74.60 cm/s  Systemic Diam: 2.00 cm MV A velocity: 91.30 cm/s MV E/A ratio:  0.82 Harold Hedge MD Electronically signed by Harold Hedge MD Signature Date/Time: 08/29/2020/8:55:55 AM    Final     (Echo, Carotid, EGD, Colonoscopy, ERCP)    Subjective: Seen and examined on the day of discharge.  Stable, no distress.  Ambulated without issue.  No dysuria reported.  No fevers.  Stable for discharge home.  Discharge Exam: Vitals:   08/29/20 0734 08/29/20 1137  BP: (!) 165/87 (!) 157/84  Pulse: 99 91  Resp: 20 20  Temp: 98.9 F (37.2 C) 98.9 F (37.2 C)  SpO2: 97% 100%   Vitals:   08/28/20 2357 08/29/20 0434 08/29/20 0734 08/29/20 1137  BP: (!) 165/87 (!) 165/80 (!) 165/87 (!) 157/84  Pulse: 95  93 99 91  Resp: Temp: 98.2 F (36.8 C) 98.7 F (37.1 C) 98.9 F (37.2 C) 98.9 F (37.2 C)  TempSrc:  Oral Oral Oral  SpO2: 95% 98% 97% 100%  Weight:      Height:        General: Pt is alert, awake, not in acute distress Cardiovascular: RRR, S1/S2 +, no rubs, no gallops Respiratory: CTA bilaterally, no wheezing, no rhonchi Abdominal: Obese, nontender, nondistended, normal bowel sounds Extremities: no edema, no cyanosis    The results of significant diagnostics from this hospitalization (including imaging, microbiology, ancillary and laboratory) are listed below for reference.     Microbiology: Recent Results (from the past 240 hour(s))  Resp Panel by RT-PCR (Flu A&B, Covid) Nasopharyngeal Swab     Status: None   Collection Time: 08/27/20  9:30 AM   Specimen: Nasopharyngeal Swab; Nasopharyngeal(NP) swabs in vial transport medium  Result Value Ref Range Status   SARS Coronavirus 2 by RT PCR NEGATIVE NEGATIVE Final    Comment: (NOTE) SARS-CoV-2 target nucleic acids are NOT DETECTED.  The SARS-CoV-2 RNA is generally detectable in upper respiratory specimens during the acute phase of infection. The lowest concentration of SARS-CoV-2 viral copies  this assay can detect is 138 copies/mL. A negative result does not preclude SARS-Cov-2 infection and should not be used as the sole basis for treatment or other patient management decisions. A negative result may occur with  improper specimen collection/handling, submission of specimen other than nasopharyngeal swab, presence of viral mutation(s) within the areas targeted by this assay, and inadequate number of viral copies(<138 copies/mL). A negative result must be combined with clinical observations, patient history, and epidemiological information. The expected result is Negative.  Fact Sheet for Patients:  BloggerCourse.com  Fact Sheet for Healthcare Providers:  SeriousBroker.it  This test is no t yet approved or cleared by the [place] FDA and  has been authorized for detection and/or diagnosis of SARS-CoV-2 by FDA under an Emergency Use Authorization (EUA). This EUA will remain  in effect (meaning this test can be used) for the duration of the COVID-19 declaration under Section 564(b)(1) of the Act, 21 U.S.C.section 360bbb-3(b)(1), unless the authorization is terminated  or revoked sooner.       Influenza A by PCR NEGATIVE NEGATIVE Final   Influenza B by PCR NEGATIVE NEGATIVE Final    Comment: (NOTE) The Xpert Xpress SARS-CoV-2/FLU/RSV plus assay is intended as an aid in the diagnosis of influenza from Nasopharyngeal swab specimens and should not be used as a sole basis for treatment. Nasal washings and aspirates are unacceptable for Xpert Xpress SARS-CoV-2/FLU/RSV testing.  Fact Sheet for Patients: BloggerCourse.com  Fact Sheet for Healthcare Providers: SeriousBroker.it  This test is not yet approved or cleared by the [place] FDA and has been authorized for detection and/or diagnosis of SARS-CoV-2 by FDA under an Emergency Use Authorization (EUA). This EUA  will remain in effect (meaning this test can be used) for the duration of the COVID-19 declaration under Section 564(b)(1) of the Act, 21 U.S.C. section 360bbb-3(b)(1), unless the authorization is terminated or revoked.  Performed at [facility], [address] [place], [place] [address]   Resp Panel by RT-PCR (Flu A&B, Covid) Nasopharyngeal Swab     Status: None   Collection Time: 08/27/20 11:05 AM   Specimen: Nasopharyngeal Swab; Nasopharyngeal(NP) swabs in vial transport medium  Result Value Ref Range Status   SARS Coronavirus 2 by RT PCR NEGATIVE  NEGATIVE Final    Comment: (NOTE) SARS-CoV-2 target nucleic acids are NOT DETECTED.  The SARS-CoV-2 RNA is generally detectable in upper respiratory specimens during the acute phase of infection. The lowest concentration of SARS-CoV-2 viral copies this assay can detect is 138 copies/mL. A negative result does not preclude SARS-Cov-2 infection and should not be used as the sole basis for treatment or other patient management decisions. A negative result may occur with  improper specimen collection/handling, submission of specimen other than nasopharyngeal swab, presence of viral mutation(s) within the areas targeted by this assay, and inadequate number of viral copies(<138 copies/mL). A negative result must be combined with clinical observations, patient history, and epidemiological information. The expected result is Negative.  Fact Sheet for Patients:  BloggerCourse.com  Fact Sheet for Healthcare Providers:  SeriousBroker.it  This test is no t yet approved or cleared by the [place] FDA and  has been authorized for detection and/or diagnosis of SARS-CoV-2 by FDA under an Emergency Use Authorization (EUA). This EUA will remain  in effect (meaning this test can be used) for the duration of the COVID-19 declaration under Section 564(b)(1) of the Act,  21 U.S.C.section 360bbb-3(b)(1), unless the authorization is terminated  or revoked sooner.       Influenza A by PCR NEGATIVE NEGATIVE Final   Influenza B by PCR NEGATIVE NEGATIVE Final    Comment: (NOTE) The Xpert Xpress SARS-CoV-2/FLU/RSV plus assay is intended as an aid in the diagnosis of influenza from Nasopharyngeal swab specimens and should not be used as a sole basis for treatment. Nasal washings and aspirates are unacceptable for Xpert Xpress SARS-CoV-2/FLU/RSV testing.  Fact Sheet for Patients: BloggerCourse.com  Fact Sheet for Healthcare Providers: SeriousBroker.it  This test is not yet approved or cleared by the [place] FDA and has been authorized for detection and/or diagnosis of SARS-CoV-2 by FDA under an Emergency Use Authorization (EUA). This EUA will remain in effect (meaning this test can be used) for the duration of the COVID-19 declaration under Section 564(b)(1) of the Act, 21 U.S.C. section 360bbb-3(b)(1), unless the authorization is terminated or revoked.  Performed at [facility], [address] [place], [place] [address]   Blood Culture (routine x 2)     Status: None (Preliminary result)   Collection Time: 08/27/20 11:05 AM   Specimen: BLOOD  Result Value Ref Range Status   Specimen Description BLOOD BLOOD RIGHT FOREARM  Final   Special Requests   Final    BOTTLES DRAWN AEROBIC AND ANAEROBIC Blood Culture results may not be optimal due to an excessive volume of blood received in culture bottles   Culture   Final    NO GROWTH 2 DAYS Performed at [facility], [address] [place], [place] [address]    Report Status PENDING  Incomplete  Blood Culture (routine x 2)     Status: None (Preliminary result)   Collection Time: 08/27/20 11:05 AM   Specimen: BLOOD  Result Value Ref Range Status   Specimen Description BLOOD BLOOD RIGHT FOREARM  Final   Special Requests   Final     BOTTLES DRAWN AEROBIC AND ANAEROBIC Blood Culture adequate volume   Culture   Final    NO GROWTH 2 DAYS Performed at [facility], [address], [place], [place] [address]    Report Status PENDING  Incomplete  Urine culture     Status: None   Collection Time: 08/27/20 11:05 AM   Specimen: In/Out Cath Urine  Result  Value Ref Range Status   Specimen Description   Final    IN/OUT CATH URINE Performed at [facility], [address] [place], [place] [address]    Special Requests   Final    NONE Performed at [facility], [address] [place], [place] [address]    Culture   Final    NO GROWTH Performed at [facility], [address] [place], [place] [address]    Report Status 08/28/2020 FINAL  Final     Labs: BNP (last 3 results) Recent Labs    08/28/20 0429  BNP 278.7*   Basic Metabolic Panel: Recent Labs  Lab 08/27/20 0930 08/27/20 1105 08/28/20 0429 08/29/20 0754  NA 137 135 140 140  K 3.9 3.6 3.6 3.4*  CL 98 100 104 102  CO2 26 24 25 27   GLUCOSE 193* 176* 132* 101*  BUN 14 14 15 16   CREATININE 1.08 1.04 0.85 0.82  CALCIUM 9.6 9.5 9.0 8.8*   Liver Function Tests: Recent Labs  Lab 08/27/20 0930 08/27/20 1105  AST 26 26  ALT 20 18  ALKPHOS 55 52  BILITOT 2.2* 1.8*  PROT 8.8* 8.2*  ALBUMIN 3.9 3.9   No results for input(s): LIPASE, AMYLASE in the last 168 hours. No results for input(s): AMMONIA in the last 168 hours. CBC: Recent Labs  Lab 08/27/20 0930 08/27/20 1105 08/28/20 0429 08/29/20 0754  WBC 33.8* 34.3* 31.1* 21.9*  NEUTROABS 29.6* 30.3*  --  19.0*  HGB 11.8* 11.6* 10.6* 11.4*  HCT 35.5* 34.9* 32.6* 34.4*  MCV 95.2 96.1 97.3 97.5  PLT 286 291 251 280   Cardiac Enzymes: No results for input(s): CKTOTAL, CKMB, CKMBINDEX, TROPONINI in the last 168 hours. BNP: Invalid input(s): POCBNP CBG: Recent Labs  Lab 08/28/20 1142 08/28/20 1658 08/28/20 2108 08/29/20 0759 08/29/20 1224  GLUCAP  103* 90 122* 88 103*   D-Dimer No results for input(s): DDIMER in the last 72 hours. Hgb A1c No results for input(s): HGBA1C in the last 72 hours. Lipid Profile No results for input(s): CHOL, HDL, LDLCALC, TRIG, CHOLHDL, LDLDIRECT in the last 72 hours. Thyroid function studies No results for input(s): TSH, T4TOTAL, T3FREE, THYROIDAB in the last 72 hours.  Invalid input(s): FREET3 Anemia work up No results for input(s): VITAMINB12, FOLATE, FERRITIN, TIBC, IRON, RETICCTPCT in the last 72 hours. Urinalysis    Component Value Date/Time   COLORURINE YELLOW (A) 08/27/2020 1105   APPEARANCEUR CLEAR (A) 08/27/2020 1105   LABSPEC >1.046 (H) 08/27/2020 1105   PHURINE 7.0 08/27/2020 1105   GLUCOSEU NEGATIVE 08/27/2020 1105   HGBUR MODERATE (A) 08/27/2020 1105   BILIRUBINUR NEGATIVE 08/27/2020 1105   KETONESUR NEGATIVE 08/27/2020 1105   PROTEINUR 30 (A) 08/27/2020 1105   NITRITE NEGATIVE 08/27/2020 1105   LEUKOCYTESUR MODERATE (A) 08/27/2020 1105   Sepsis Labs Invalid input(s): PROCALCITONIN,  WBC,  LACTICIDVEN Microbiology Recent Results (from the past 240 hour(s))  Resp Panel by RT-PCR (Flu A&B, Covid) Nasopharyngeal Swab     Status: None   Collection Time: 08/27/20  9:30 AM   Specimen: Nasopharyngeal Swab; Nasopharyngeal(NP) swabs in vial transport medium  Result Value Ref Range Status   SARS Coronavirus 2 by RT PCR NEGATIVE NEGATIVE Final    Comment: (NOTE) SARS-CoV-2 target nucleic acids are NOT DETECTED.  The SARS-CoV-2 RNA is generally detectable in upper respiratory specimens during the acute phase of infection. The lowest concentration of SARS-CoV-2 viral copies this assay can detect is 138 copies/mL. A  negative result does not preclude SARS-Cov-2 infection and should not be used as the sole basis for treatment or other patient management decisions. A negative result may occur with  improper specimen collection/handling, submission of specimen other than nasopharyngeal  swab, presence of viral mutation(s) within the areas targeted by this assay, and inadequate number of viral copies(<138 copies/mL). A negative result must be combined with clinical observations, patient history, and epidemiological information. The expected result is Negative.  Fact Sheet for Patients:  BloggerCourse.com  Fact Sheet for Healthcare Providers:  SeriousBroker.it  This test is no t yet approved or cleared by the [place] FDA and  has been authorized for detection and/or diagnosis of SARS-CoV-2 by FDA under an Emergency Use Authorization (EUA). This EUA will remain  in effect (meaning this test can be used) for the duration of the COVID-19 declaration under Section 564(b)(1) of the Act, 21 U.S.C.section 360bbb-3(b)(1), unless the authorization is terminated  or revoked sooner.       Influenza A by PCR NEGATIVE NEGATIVE Final   Influenza B by PCR NEGATIVE NEGATIVE Final    Comment: (NOTE) The Xpert Xpress SARS-CoV-2/FLU/RSV plus assay is intended as an aid in the diagnosis of influenza from Nasopharyngeal swab specimens and should not be used as a sole basis for treatment. Nasal washings and aspirates are unacceptable for Xpert Xpress SARS-CoV-2/FLU/RSV testing.  Fact Sheet for Patients: BloggerCourse.com  Fact Sheet for Healthcare Providers: SeriousBroker.it  This test is not yet approved or cleared by the [place] FDA and has been authorized for detection and/or diagnosis of SARS-CoV-2 by FDA under an Emergency Use Authorization (EUA). This EUA will remain in effect (meaning this test can be used) for the duration of the COVID-19 declaration under Section 564(b)(1) of the Act, 21 U.S.C. section 360bbb-3(b)(1), unless the authorization is terminated or revoked.  Performed at [facility], [address] [place], [place] [address]   Resp  Panel by RT-PCR (Flu A&B, Covid) Nasopharyngeal Swab     Status: None   Collection Time: 08/27/20 11:05 AM   Specimen: Nasopharyngeal Swab; Nasopharyngeal(NP) swabs in vial transport medium  Result Value Ref Range Status   SARS Coronavirus 2 by RT PCR NEGATIVE NEGATIVE Final    Comment: (NOTE) SARS-CoV-2 target nucleic acids are NOT DETECTED.  The SARS-CoV-2 RNA is generally detectable in upper respiratory specimens during the acute phase of infection. The lowest concentration of SARS-CoV-2 viral copies this assay can detect is 138 copies/mL. A negative result does not preclude SARS-Cov-2 infection and should not be used as the sole basis for treatment or other patient management decisions. A negative result may occur with  improper specimen collection/handling, submission of specimen other than nasopharyngeal swab, presence of viral mutation(s) within the areas targeted by this assay, and inadequate number of viral copies(<138 copies/mL). A negative result must be combined with clinical observations, patient history, and epidemiological information. The expected result is Negative.  Fact Sheet for Patients:  BloggerCourse.com  Fact Sheet for Healthcare Providers:  SeriousBroker.it  This test is no t yet approved or cleared by the [place] FDA and  has been authorized for detection and/or diagnosis of SARS-CoV-2 by FDA under an Emergency Use Authorization (EUA). This EUA will remain  in effect (meaning this test can be used) for the duration of the COVID-19 declaration under Section 564(b)(1) of the Act, 21 U.S.C.section 360bbb-3(b)(1), unless the authorization is terminated  or revoked sooner.       Influenza A by PCR  NEGATIVE NEGATIVE Final   Influenza B by PCR NEGATIVE NEGATIVE Final    Comment: (NOTE) The Xpert Xpress SARS-CoV-2/FLU/RSV plus assay is intended as an aid in the diagnosis of influenza from Nasopharyngeal  swab specimens and should not be used as a sole basis for treatment. Nasal washings and aspirates are unacceptable for Xpert Xpress SARS-CoV-2/FLU/RSV testing.  Fact Sheet for Patients: BloggerCourse.com  Fact Sheet for Healthcare Providers: SeriousBroker.it  This test is not yet approved or cleared by the [place] FDA and has been authorized for detection and/or diagnosis of SARS-CoV-2 by FDA under an Emergency Use Authorization (EUA). This EUA will remain in effect (meaning this test can be used) for the duration of the COVID-19 declaration under Section 564(b)(1) of the Act, 21 U.S.C. section 360bbb-3(b)(1), unless the authorization is terminated or revoked.  Performed at [facility], [address] [place], [place] [address]   Blood Culture (routine x 2)     Status: None (Preliminary result)   Collection Time: 08/27/20 11:05 AM   Specimen: BLOOD  Result Value Ref Range Status   Specimen Description BLOOD BLOOD RIGHT FOREARM  Final   Special Requests   Final    BOTTLES DRAWN AEROBIC AND ANAEROBIC Blood Culture results may not be optimal due to an excessive volume of blood received in culture bottles   Culture   Final    NO GROWTH 2 DAYS Performed at [facility], [address], [place], [place] [address]    Report Status PENDING  Incomplete  Blood Culture (routine x 2)     Status: None (Preliminary result)   Collection Time: 08/27/20 11:05 AM   Specimen: BLOOD  Result Value Ref Range Status   Specimen Description BLOOD BLOOD RIGHT FOREARM  Final   Special Requests   Final    BOTTLES DRAWN AEROBIC AND ANAEROBIC Blood Culture adequate volume   Culture   Final    NO GROWTH 2 DAYS Performed at [facility], [address] [place], [place] [address]    Report Status PENDING  Incomplete  Urine culture     Status: None   Collection Time: 08/27/20 11:05 AM   Specimen: In/Out Cath Urine   Result Value Ref Range Status   Specimen Description   Final    IN/OUT CATH URINE Performed at [facility], [address], [place], [place] [address]    Special Requests   Final    NONE Performed at [facility], [address], [place], [place] [address]    Culture   Final    NO GROWTH Performed at [facility], [address] [place], [place] [address]    Report Status 08/28/2020 FINAL  Final     Time coordinating discharge: Over 30 minutes  SIGNED:   Tresa Moore, MD  [facility] 08/29/2020, 2:08 PM Pager   If 7PM-7AM, please contact night-coverage

## 2020-08-29 NOTE — Progress Notes (Signed)
Mobility Specialist - Progress Note   08/29/20 1100  Mobility  Activity Ambulated in room  Range of Motion/Exercises Right leg;Left leg (SLR, SM, STS, 360)  Level of Assistance Standby assist, set-up cues, supervision of patient - no hands on  Assistive Device None  Distance Ambulated (ft) 20 ft  Mobility Response Tolerated well  Mobility performed by Mobility specialist  $Mobility charge 1 Mobility    Pre-mobility: 105 HR, 94% SpO2 Post-mobility: 103 HR, 100% SpO2   Pt was lying in bed upon arrival utilizing room air. Pt agreed to session. Pt denied pain, nausea, and fatigue. Pt was able to get EOB with minA. Pt denied dizziness upon sitting. Pt performed seated exercises: straight leg raises x10, seated march x20, STS x2, and abduction x10. Pt stood at bedside with supervision. No AD in use for session. Pt ambulated 10' forward and 10' backward with supervision. Pt also able to complete 360 turns (L/R) with supervision. No LOB noted. Pt denied SOB throughout session. Overall, pt tolerated session well. Pt was left in bed with all needs in reach.    Filiberto Pinks Mobility Specialist 08/29/20, 11:35 AM

## 2020-09-01 LAB — CULTURE, BLOOD (ROUTINE X 2)
Culture: NO GROWTH
Culture: NO GROWTH
Special Requests: ADEQUATE

## 2022-06-24 IMAGING — DX DG CHEST 1V PORT
1 series · 1 of 1 positions shown · non-contrast
Comparison: None.

CLINICAL DATA: Chest pain and questionable sepsis

EXAM:
PORTABLE CHEST 1 VIEW

[chest ap]
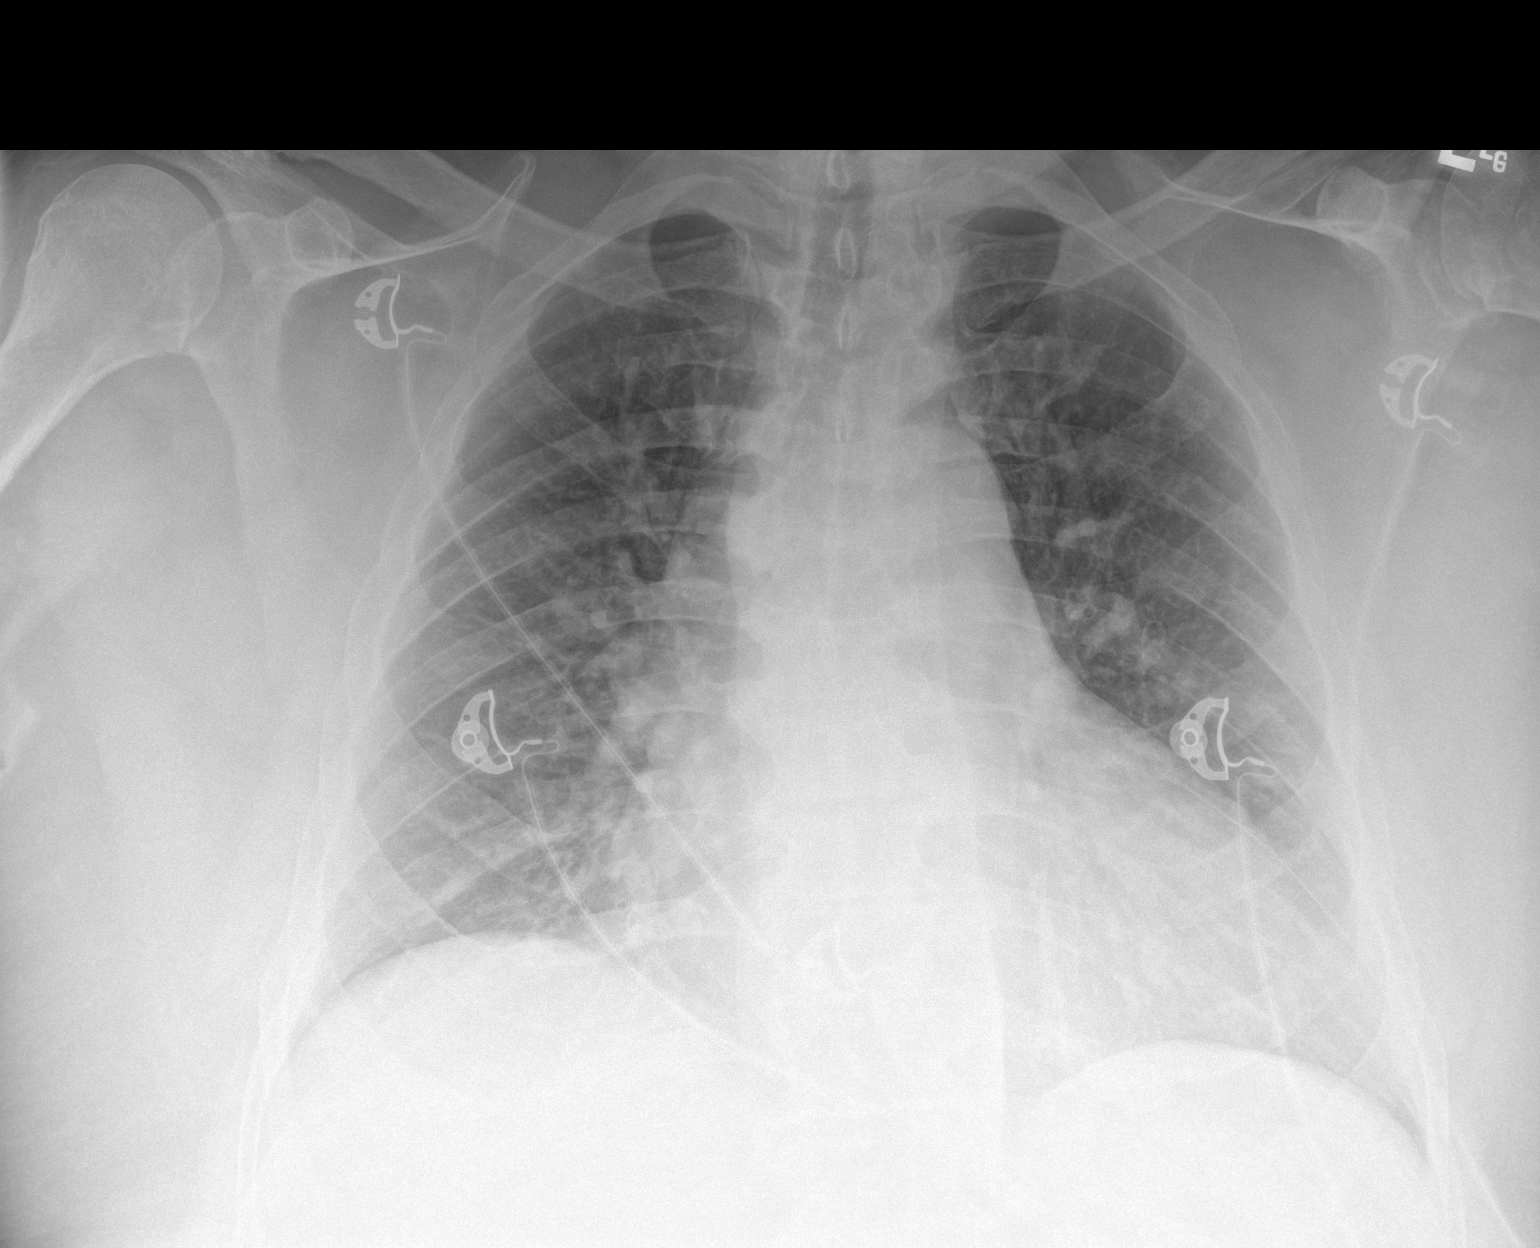

[1 of 1 positions shown; findings below may reference images not displayed]

FINDINGS: Cardiac shadow is enlarged. Central vascular congestion is noted. No
significant CAD is seen. No focal infiltrate is noted.
IMPRESSION: Mild central vascular congestion without definitive edema.
